# Patient Record
Sex: Female | Born: 1995 | Race: White | Hispanic: No | Marital: Single | Smoking: Never smoker
Health system: Southern US, Community
[De-identification: ages and names within clinical notes are randomized; demographics above are authoritative.]

## PROBLEM LIST (undated history)

## (undated) ENCOUNTER — Emergency Department (HOSPITAL_COMMUNITY): Admission: EM | Payer: Self-pay | Source: Home / Self Care

## (undated) DIAGNOSIS — J309 Allergic rhinitis, unspecified: Secondary | ICD-10-CM

## (undated) DIAGNOSIS — L709 Acne, unspecified: Secondary | ICD-10-CM

## (undated) HISTORY — DX: Acne, unspecified: L70.9

## (undated) HISTORY — DX: Allergic rhinitis, unspecified: J30.9

---
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
## 2017-02-12 IMAGING — DX DG FOOT COMPLETE 3+V*L*
3 series · 3 of 3 positions shown · non-contrast
Comparison: None.

CLINICAL DATA: Ballet dancer. Left foot pain and tenderness for 1
week. Initial encounter.

EXAM:
LEFT FOOT - COMPLETE 3+ VIEW

[foot ap]
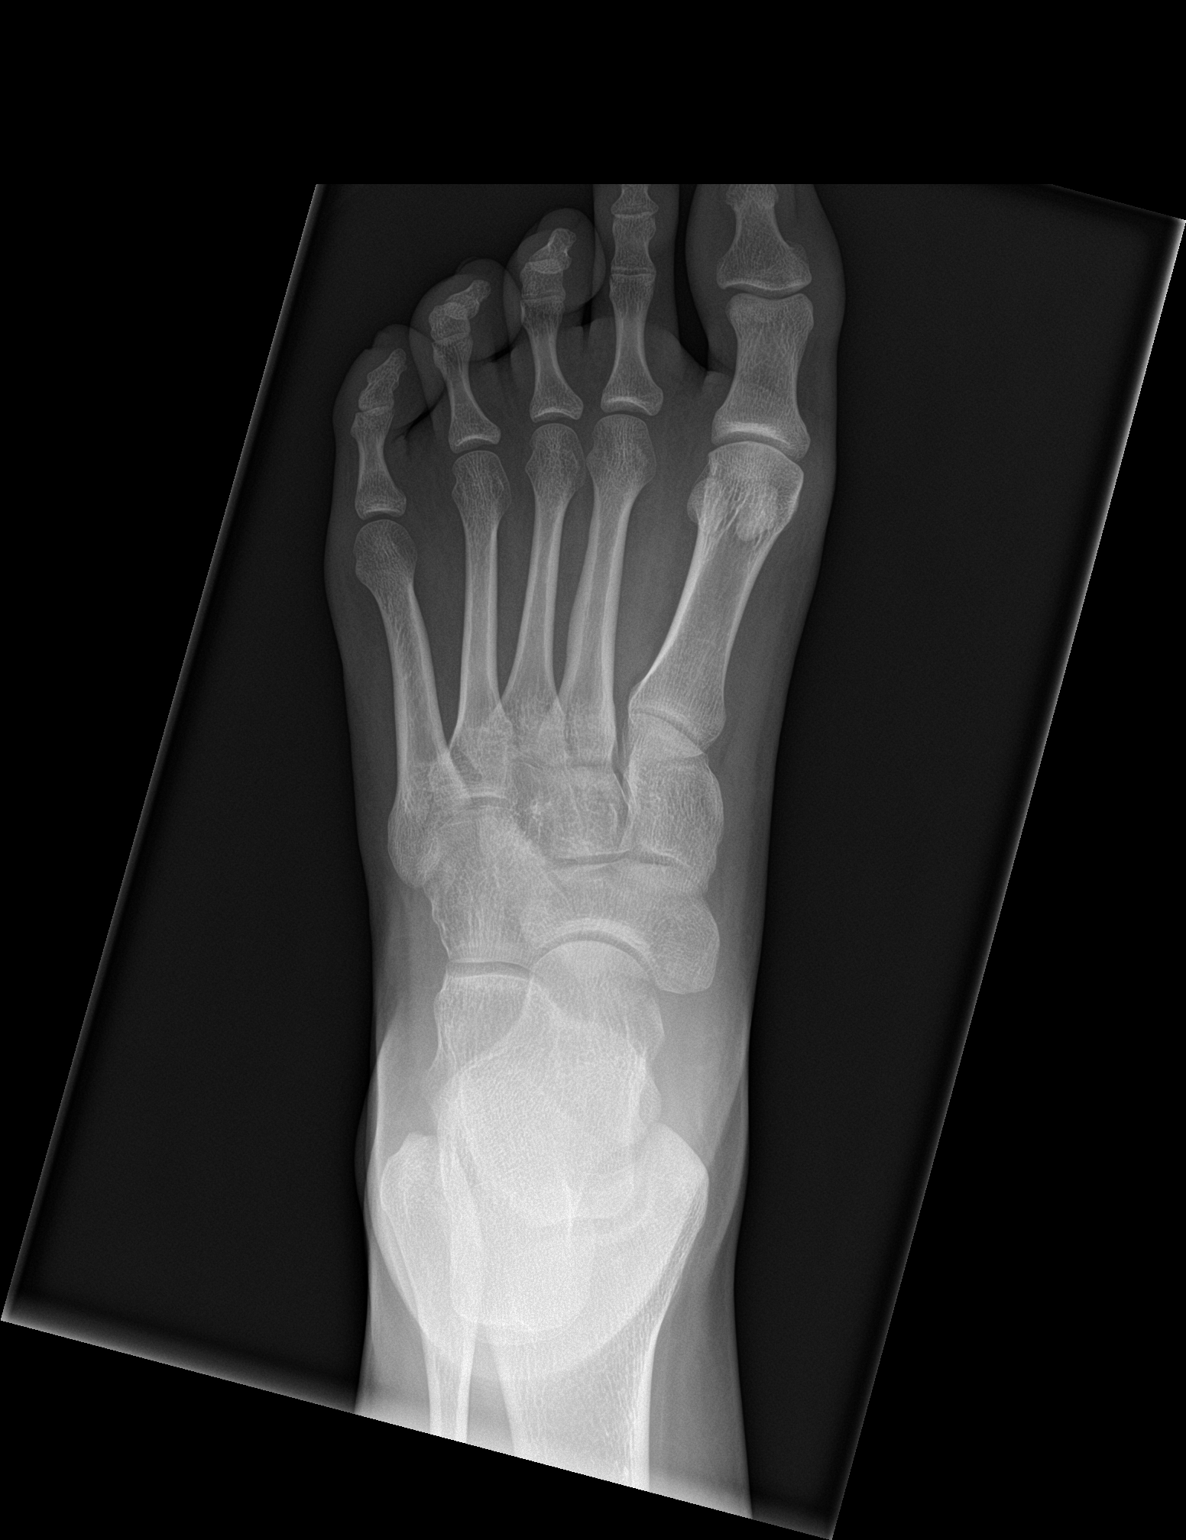

[foot obl]
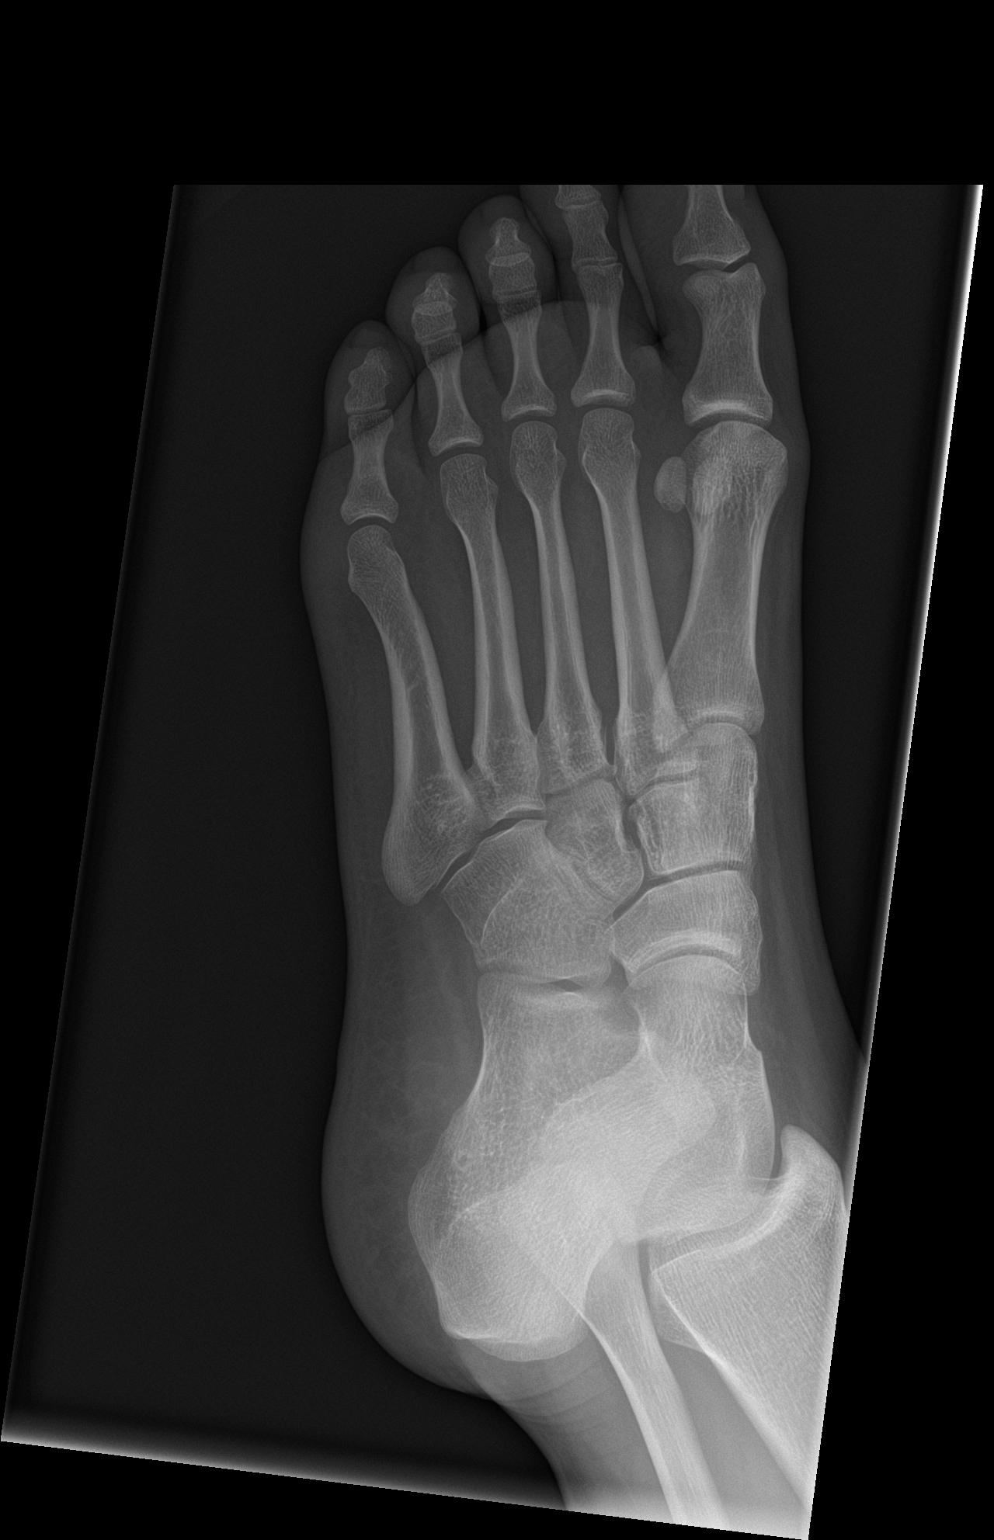

[foot lat]
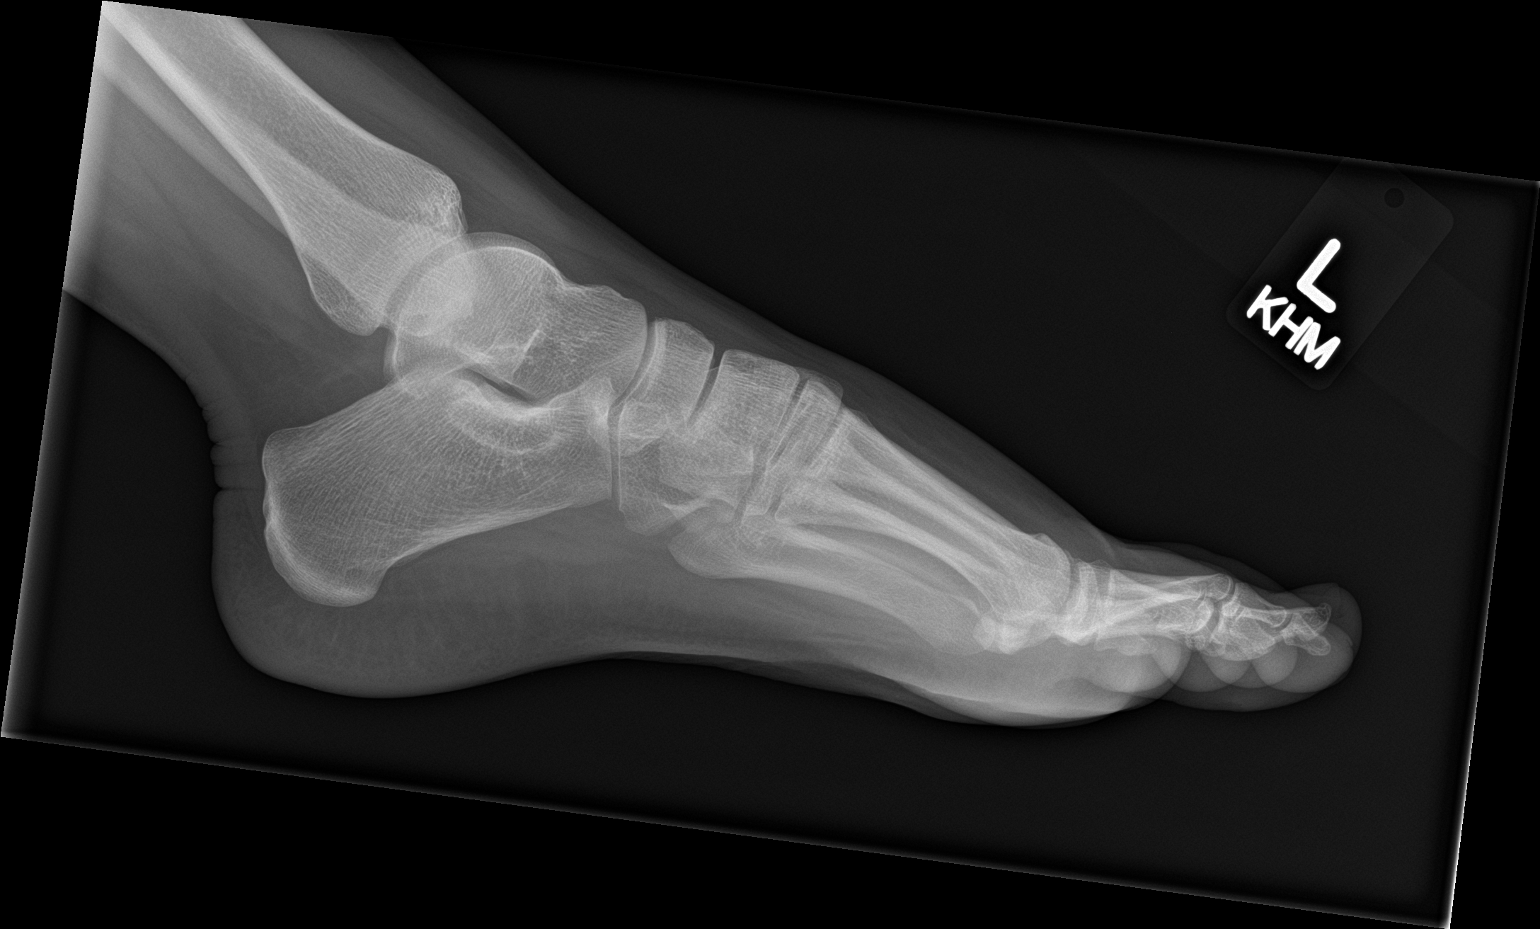

[3 of 3 positions shown; findings below may reference images not displayed]

FINDINGS: There is no evidence of fracture or dislocation. There is no
evidence of arthropathy or other focal bone abnormality. Soft
tissues are unremarkable.
IMPRESSION: Negative.

## 2017-05-16 DIAGNOSIS — J029 Acute pharyngitis, unspecified: Secondary | ICD-10-CM | POA: Diagnosis not present

## 2017-07-28 DIAGNOSIS — L700 Acne vulgaris: Secondary | ICD-10-CM | POA: Diagnosis not present

## 2017-10-13 DIAGNOSIS — J029 Acute pharyngitis, unspecified: Secondary | ICD-10-CM | POA: Diagnosis not present

## 2017-12-22 ENCOUNTER — Encounter: Payer: Self-pay | Admitting: Pediatrics

## 2018-02-24 ENCOUNTER — Encounter: Payer: Self-pay | Admitting: Pediatrics

## 2018-02-24 ENCOUNTER — Ambulatory Visit (INDEPENDENT_AMBULATORY_CARE_PROVIDER_SITE_OTHER): Payer: No Typology Code available for payment source | Admitting: Pediatrics

## 2018-02-24 VITALS — BP 104/72 | Ht 62.4 in | Wt 136.5 lb

## 2018-02-24 DIAGNOSIS — Z23 Encounter for immunization: Secondary | ICD-10-CM | POA: Diagnosis not present

## 2018-02-24 DIAGNOSIS — Z0000 Encounter for general adult medical examination without abnormal findings: Secondary | ICD-10-CM

## 2018-02-24 DIAGNOSIS — Z00129 Encounter for routine child health examination without abnormal findings: Secondary | ICD-10-CM

## 2018-02-24 LAB — POCT HEMOGLOBIN: HEMOGLOBIN: 16.2 g/dL — AB (ref 11–14.6)

## 2018-02-24 NOTE — Patient Instructions (Signed)
  Place appropriate patient instructions here. 

## 2018-02-24 NOTE — Progress Notes (Signed)
Adolescent Well Care Visit Emma Martinez is a 23 y.o. female who is here for well care.    PCP:  Shirlean Kelly, MD    History was provided by the patient and mother.  Confidentiality was discussed with the patient and, if applicable, with caregiver as well. Patient's personal or confidential phone number: [phone]   Current Issues: Current concerns include none    .   Nutrition: Nutrition/Eating Behaviors: balanced diet  Adequate calcium in diet?: yes  Supplements/ Vitamins: no   Exercise/ Media: Play any Sports?/ Exercise: daily  Screen Time:  > 2 hours-counseling provided Media Rules or Monitoring?: yes  Sleep:  Sleep: 9 hours   Social Screening: Lives with:  Parents  Parental relations:  good Activities, Work, and Regulatory affairs officer?: chores  Concerns regarding behavior with peers?  no Stressors of note: no  Education:  School Grade: college  School performance: doing well; no concerns School Behavior: doing well; no concerns  Menstruation:   A month ago  Menstrual History: normal monthly for 4-5 days    Confidential Social History: Tobacco?  no Secondhand smoke exposure?  no Drugs/ETOH?  no  Sexually Active?  no   Pregnancy Prevention: no sex and OCP   Safe at home, in school & in relationships?  Yes Safe to self?  Yes   Screenings: Patient has a dental home: yes  The patient completed the Rapid Assessment for Adolescent Preventive Services screening questionnaire and the following topics were identified as risk factors and discussed: healthy eating, exercise, seatbelt use, tobacco use, marijuana use, birth control, suicidality/self harm, mental health issues, social isolation and family problems  In addition, the following topics were discussed as part of anticipatory guidance healthy eating, exercise, drug use, birth control, suicidality/self harm, mental health issues, social isolation and family problems.  PHQ-9 completed and results indicated normal    Physical Exam:  Vitals:   02/24/18 1534  BP: 104/72  Weight: 136 lb 8 oz (61.9 kg)  Height: 5' 2.4" (1.585 m)   BP 104/72   Ht 5' 2.4" (1.585 m)   Wt 136 lb 8 oz (61.9 kg)   BMI 24.65 kg/m  Body mass index: body mass index is 24.65 kg/m. Growth percentile SmartLinks can only be used for patients less than 26 years old.   Hearing Screening   125Hz  250Hz  500Hz  1000Hz  2000Hz  3000Hz  4000Hz  6000Hz  8000Hz   Right ear:   20 20 20 20 20     Left ear:   20 20 20 20 20       Visual Acuity Screening   Right eye Left eye Both eyes  Without correction: 20/20 20/20   With correction:       General Appearance:   alert, oriented, no acute distress  HENT: Normocephalic, no obvious abnormality, conjunctiva clear  Mouth:   Normal appearing teeth, no obvious discoloration, dental caries, or dental caps  Neck:   Supple; thyroid: no enlargement, symmetric, no tenderness/mass/nodules  Chest No breast masses   Lungs:   Clear to auscultation bilaterally, normal work of breathing  Heart:   Regular rate and rhythm, S1 and S2 normal, no murmurs;   Abdomen:   Soft, non-tender, no mass, or organomegaly  GU genitalia not examined  Musculoskeletal:   Tone and strength strong and symmetrical, all extremities               Lymphatic:   No cervical adenopathy  Skin/Hair/Nails:   Skin warm, dry and intact, no rashes, no bruises or petechiae  Neurologic:   Strength, gait, and coordination normal and age-appropriate     Assessment and Plan:   23 yo healthy female   BMI is appropriate for age  Hearing screening result:normal Vision screening result: normal  Counseling provided for all of the vaccine components  Orders Placed This Encounter  Procedures  . GC/Chlamydia Probe Amp([facility])  . Meningococcal B, OMV (Bexsero)  . POCT hemoglobin     No follow-ups on file.. Discussed transition of care. Her mom has been looking for a new doctor for next year.   Richrd SoxQuan T Zoiee Wimmer, MD

## 2018-02-25 LAB — GC/CHLAMYDIA PROBE AMP
CHLAMYDIA, DNA PROBE: NEGATIVE
Neisseria gonorrhoeae by PCR: NEGATIVE

## 2018-08-12 ENCOUNTER — Other Ambulatory Visit: Payer: Self-pay

## 2018-08-12 ENCOUNTER — Ambulatory Visit (INDEPENDENT_AMBULATORY_CARE_PROVIDER_SITE_OTHER): Payer: No Typology Code available for payment source | Admitting: Family Medicine

## 2018-08-12 ENCOUNTER — Encounter: Payer: Self-pay | Admitting: Family Medicine

## 2018-08-12 ENCOUNTER — Encounter (INDEPENDENT_AMBULATORY_CARE_PROVIDER_SITE_OTHER): Payer: Self-pay

## 2018-08-12 VITALS — BP 119/75 | HR 73 | Temp 98.9°F | Resp 12 | Ht 62.0 in | Wt 131.0 lb

## 2018-08-12 DIAGNOSIS — J3081 Allergic rhinitis due to animal (cat) (dog) hair and dander: Secondary | ICD-10-CM | POA: Diagnosis not present

## 2018-08-12 DIAGNOSIS — L709 Acne, unspecified: Secondary | ICD-10-CM | POA: Diagnosis not present

## 2018-08-12 NOTE — Patient Instructions (Signed)
    Thank you for coming into the office today. I appreciate the opportunity to provide you with the care for your health and wellness. Today we discussed: overall health  Follow Up: 1 year (annual w/o pap) or as needed  No labs today. No referrals needed. Please get set up with [facility] for GYN needs, as soon you will need your first pap.  Continue to enjoy SUMMER, safely!   Work to get more WATER in your diet.   HEALTH MAINTENANCE RECOMMENDATIONS:  It is recommended that you get at least 30 minutes of aerobic exercise at least 5 days/week (for weight loss, you may need as much as 60-90 minutes). This can be any activity that gets your heart rate up. This can be divided in 10-15 minute intervals if needed, but try and build up your endurance at least once a week.  Weight bearing exercise is also recommended twice weekly.  Eat a healthy diet with lots of vegetables, fruits and fiber.  "Colorful" foods have a lot of vitamins (ie green vegetables, tomatoes, red peppers, etc).  Limit sweet tea, regular sodas and alcoholic beverages, all of which has a lot of calories and sugar.  Up to 1 alcoholic drink daily may be beneficial for women (unless trying to lose weight, watch sugars).  Drink a lot of water.  Calcium recommendations are 1200-1500 mg daily and vitamin D 1000 IU daily.  This should be obtained from diet and/or supplements (vitamins), and calcium should not be taken all at once, but in divided doses.  Monthly self breast exams. Usually after your cycle ends is best time to do these.  Sunscreen of at least SPF 30 should be used on all sun-exposed parts of the skin when outside between the hours of 10 am and 4 pm (not just when at beach or pool, but even with exercise, golf, tennis, and yard work!)  Use a sunscreen that says "broad spectrum" so it covers both UVA and UVB rays, and make sure to reapply every 1-2 hours.  Remember to change the batteries in your smoke detectors when  changing your clock times in the spring and fall.  Use your seat belt every time you are in a car, and please drive safely and not be distracted with cell phones and texting while driving.  Please continue to practice social distancing to keep you, your family, and our community safe. Wear a mask as you did to in the office if you must go out in public. And practice good hand washing.  [place] YOUR HANDS WELL AND FREQUENTLY. AVOID TOUCHING YOUR FACE, UNLESS YOUR HANDS ARE FRESHLY WASHED.  GET FRESH AIR DAILY. STAY HYDRATED WITH WATER.   It was a pleasure to see you and I look forward to continuing to work together on your health and well-being. Please do not hesitate to call the office if you need care or have questions about your care.  Have a wonderful day and weekend. With Gratitude,  Cherly Beach, DNP, AGNP-BC

## 2018-08-12 NOTE — Progress Notes (Signed)
Subjective:     Patient ID: Emma Martinez, female   DOB: 12/21/1995, 22 y.o.   MRN: [id number]  Emma Martinez presents for New Patient (Initial Visit) (establish care)  [name] is a 23 year old female patient who presents today to establish care with [facility] as she is turning [age] in November and will no longer be able to be seen at her pediatrician's office.  She is a Health and safety inspectorrising junior at [employer] majoring in stats unsure of what she would like to do with this but she does enjoy doing math.  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  Mother is a Runner, [employer] employee.  Has a history that includes allergic rhinitis is on Claritin reports she is allergic to pretty much anything that has animal hair.  History of acne is currently on a birth control pill to help with regulation of breakouts.  Pertinent family history would include diabetes, heart disease.  Both parents are still healthy at this time.  No history of surgeries.  Does not smoke, does not drink, does not use drugs.  Is sexually active uses birth control and condoms as preventative and safe measures.  Lives with both her parents right now she is home from school.  Her older sister is also living at home.  Just graduated from school for speech pathology.  They have a cat and a dog that lives in the home.  She is also allergic to these.  But she is close with the dog.  She enjoys to dance and go to [place] plays in her free time.  She is not currently in any committed relationship at this time.  Reports that she eats 2 meals regularly, from hello fresh she cooks at home.  Usually does not eat breakfast.  Reports that she gets enough fruits and veggies in her diet.  She reports that she does drink a pretty good amount of caffeine will drink 3 cans or so of the diet sun drop.  Occasionally will drink coffee and tea when she is at school.  Reports that she does not do  good and drinking water 1 to 2 cups if that daily.  Reports that she works out pretty regularly 5 days a week for about 90 minutes each time.  Is been doing hit YouTube videos secondary to COVID.  Reports that she wears her seatbelt and sunscreen.  Reports having smoke and carbon monoxide detectors.  Reports that she does not use her phone while she is driving so that it does not distract her.  Overall she has no complaints today in the office.  For health maintenance she is all up-to-date on her vaccines.  Did have her annual visit back in January at the pediatrician's office.  She will be turning 23 in November and will be needing a Pap, she would like to get set up with a GYN for this.  Recommendations for family tree.  Today in the office she has no complaints of fever, chills, cough, shortness of breath or any other signs or symptoms of COVID and/or infection.  Past Medical, Surgical, Social History, Allergies, and Medications have been Reviewed.   Past Medical History:  Diagnosis Date   Acne 10/06/2012   Allergic rhinitis 10/06/2012   No past surgical history on file. Social History   Socioeconomic History   Marital status: Single    Spouse name: Not on file   Number of  children: Not on file   Years of education: Not on file   Highest education level: Not on file  Occupational History   Not on file  Social Needs   Financial resource strain: Not on file   Food insecurity    Worry: Not on file    Inability: Not on file   Transportation needs    Medical: Not on file    Non-medical: Not on file  Tobacco Use   Smoking status: Never Smoker   Smokeless tobacco: Never Used  Substance and Sexual Activity   Alcohol use: Not on file   Drug use: Not on file   Sexual activity: Not on file  Lifestyle   Physical activity    Days per week: Not on file    Minutes per session: Not on file   Stress: Not on file  Relationships   Social connections    Talks on phone:  Not on file    Gets together: Not on file    Attends religious service: Not on file    Active member of club or organization: Not on file    Attends meetings of clubs or organizations: Not on file    Relationship status: Not on file   Intimate partner violence    Fear of current or ex partner: Not on file    Emotionally abused: Not on file    Physically abused: Not on file    Forced sexual activity: Not on file  Other Topics Concern   Not on file  Social History Narrative   Lives with both parents older sister in college    Outpatient Encounter Medications as of 08/12/2018  Medication Sig   ibuprofen (ADVIL,MOTRIN) 800 MG tablet    loratadine (CLARITIN) 10 MG tablet Take 1 tablet (10 mg total) by mouth daily.   TRI-PREVIFEM 0.18/0.215/0.25 MG-35 MCG tablet    No facility-administered encounter medications on file as of 08/12/2018.    Allergies  Allergen Reactions   Amoxicillin Hives    Patient states reaction was when she was in 3rd grade.    Review of Systems  Constitutional: Negative for activity change, appetite change, chills and fever.  HENT: Positive for congestion and rhinorrhea. Negative for ear pain, facial swelling, sneezing and sore throat.   Eyes: Negative for visual disturbance.  Respiratory: Negative for cough, shortness of breath and wheezing.   Cardiovascular: Negative for chest pain and palpitations.  Gastrointestinal: Negative.   Endocrine: Negative.   Genitourinary: Negative.   Musculoskeletal: Negative.   Skin: Negative.   Allergic/Immunologic: Positive for environmental allergies.  Neurological: Negative for dizziness and headaches.  Hematological: Negative.   Psychiatric/Behavioral: Negative.   All other systems reviewed and are negative.      Objective:     BP 119/75    Pulse 73    Temp 99 F (37.2 C) (Temporal)    Resp 12    Ht 5\' 2"  (1.575 m)    Wt 131 lb 0.6 oz (59.4 kg)    SpO2 100%    BMI 23.97 kg/m   Physical Exam Vitals signs  and nursing note reviewed.  Constitutional:      Appearance: Normal appearance. She is obese.  HENT:     Head: Normocephalic and atraumatic.     Right Ear: External ear normal.     Left Ear: External ear normal.     Nose: Nose normal.  Eyes:     General:        Right eye: No  discharge.        Left eye: No discharge.     Conjunctiva/sclera: Conjunctivae normal.  Neck:     Musculoskeletal: Normal range of motion and neck supple.  Cardiovascular:     Rate and Rhythm: Normal rate and regular rhythm.     Pulses: Normal pulses.     Heart sounds: Normal heart sounds.  Pulmonary:     Effort: Pulmonary effort is normal.     Breath sounds: Normal breath sounds.  Musculoskeletal: Normal range of motion.  Skin:    General: Skin is warm and dry.  Neurological:     Mental Status: She is alert and oriented to person, place, and time.  Psychiatric:        Mood and Affect: Mood normal.        Behavior: Behavior normal.        Thought Content: Thought content normal.        Judgment: Judgment normal.        Assessment and Plan       1. Acne, unspecified acne type Controlled on birth control to help with breakouts. Continue, does not need refills. Will look at getting in with [facility] for GYN health.  2. Allergic rhinitis due to animal hair and dander Controlled on Claritin.    Follow Up: 1 year or as needed      Perlie Mayo, DNP, AGNP-BC [place], [address] [place], [place] [address] Office Hours: Mon-Thurs 8 am-5 pm; Fri 8 am-12 pm Office Phone:  [phone]  Office Fax: [phone]

## 2018-10-18 ENCOUNTER — Other Ambulatory Visit: Payer: Self-pay

## 2018-10-18 DIAGNOSIS — Z20822 Contact with and (suspected) exposure to covid-19: Secondary | ICD-10-CM

## 2018-10-20 LAB — NOVEL CORONAVIRUS, NAA: SARS-CoV-2, NAA: DETECTED — AB

## 2018-10-20 NOTE — Progress Notes (Signed)
Please let her know that the COVID test is + and to follow the guidelines provided at time of testing. If she has questions just let me know.

## 2018-12-20 ENCOUNTER — Telehealth: Payer: Self-pay | Admitting: *Deleted

## 2018-12-20 ENCOUNTER — Encounter: Payer: Self-pay | Admitting: Family Medicine

## 2018-12-20 NOTE — Telephone Encounter (Signed)
Patient sent a mychart message and I responded to that message.

## 2018-12-20 NOTE — Telephone Encounter (Signed)
Tammy pts mom (NOT ON DPR OR EMERGENCY CONTACT) called saying pt is away at college and has injured her elbow. Thought maybe she needed a referral but wanted to see what needed to be done. Told her pt would have to call the office or send mychart message as she was not on DPR and we cannot discuss anything with her.

## 2018-12-23 ENCOUNTER — Encounter: Payer: Self-pay | Admitting: Family Medicine

## 2018-12-26 ENCOUNTER — Other Ambulatory Visit: Payer: Self-pay | Admitting: Family Medicine

## 2018-12-26 ENCOUNTER — Telehealth: Payer: Self-pay | Admitting: Orthopedic Surgery

## 2018-12-26 DIAGNOSIS — T148XXA Other injury of unspecified body region, initial encounter: Secondary | ICD-10-CM

## 2018-12-26 NOTE — Telephone Encounter (Signed)
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 Pending.

## 2018-12-27 NOTE — Telephone Encounter (Signed)
Any updated notes will be entered into referral workqueue, as referral was received by patient's primary care; refer to workqueue.

## 2018-12-28 ENCOUNTER — Telehealth: Payer: Self-pay | Admitting: Orthopedic Surgery

## 2018-12-28 NOTE — Telephone Encounter (Signed)
Pt's mom called back and left message.  I called her back and she said she forgot about having the [facility] referral done in Dr. Ruthe Mannan name and she would have to call and get that changed.  She asked if Tashawnda could get an appointment with Dr. Aline Brochure next week.  I spoke to [place] regarding this.  She said it was the patient's choice.  Appointment canceled with Dr. Marlou Sa for tomorrow in [place] and appointment was made here with Dr. Aline Brochure on Monday, 01/02/19 at 8:30

## 2018-12-28 NOTE — Telephone Encounter (Signed)
Patient's mother Lynelle Smoke called this morning about an appointment.  I told her that Dr Aline Brochure would only be here through this afternoon and then would be out the rest of the week.  Tammy said that Deeanna would not be home until late this afternoon and she felt that Lavana should really be seen before next week.  I spoke to our office supervisor, Brand Males.  She suggested that since [place] wouildnt be home until later today, maybe she could be seen at [facility].  Abigail Butts spoke with pt's mom Tammy and an appointment for Kathleen has been scheduledfor Thursday, 12/29/18 at 10:15 with Dr. Marlou Sa.

## 2018-12-29 ENCOUNTER — Ambulatory Visit: Payer: No Typology Code available for payment source | Admitting: Orthopedic Surgery

## 2019-01-02 ENCOUNTER — Ambulatory Visit: Payer: No Typology Code available for payment source | Admitting: Orthopedic Surgery

## 2019-01-02 ENCOUNTER — Ambulatory Visit: Payer: No Typology Code available for payment source

## 2019-01-02 ENCOUNTER — Other Ambulatory Visit: Payer: Self-pay

## 2019-01-02 VITALS — BP 99/68 | HR 80 | Temp 97.9°F | Ht 62.0 in | Wt 135.0 lb

## 2019-01-02 DIAGNOSIS — S42401A Unspecified fracture of lower end of right humerus, initial encounter for closed fracture: Secondary | ICD-10-CM

## 2019-01-02 DIAGNOSIS — S52124A Nondisplaced fracture of head of right radius, initial encounter for closed fracture: Secondary | ICD-10-CM

## 2019-01-02 NOTE — Patient Instructions (Signed)
Radial Head Fracture  A radial head fracture is a break in the smaller bone in your forearm (radius) near the end of the bone at the elbow joint. There are two bones in your forearm. The radius, or radial bone, is the bone on the side of your thumb. There are different types of radial head fractures. The type is determined by the amount of movement (displacement) of bones from their normal positions:  Type 1. This is a small fracture in which the bone pieces remain together (nondisplaced fracture).  Type 2. The fracture is moderate, and bone pieces are slightly displaced.  Type 3. There are multiple fractures and displaced bone pieces. What are the causes? This condition is usually caused by falling and landing on an outstretched arm. What increases the risk? You are more likely to develop this condition if you:  Are female.  Are 56-79 years old.  Have weak bones or osteoporosis. What are the signs or symptoms? Symptoms of this condition include:  Swelling of the elbow joint.  Pain and difficulty when moving the elbow joint. How is this diagnosed? This condition is diagnosed based on your medical history and a physical exam. You may have X-rays to confirm the type of fracture. How is this treated? Treatment for this condition includes resting, icing, and raising (elevating) the injured area above the level of your heart. You may be given medicines to help relieve pain. Treatment varies depending on the type of fracture you have.  For a type 1 fracture, you may be given a splint or sling to keep your arm and elbow from moving for several days.  For a type 2 fracture, you may be given a splint or sling to keep your arm and elbow from moving for several days. If the displacement is more severe, you may need surgery.  For a type 3 fracture, you will usually need surgery to have bone pieces removed. The entire radial head may need to be removed if the damage is severe. Follow these  instructions at home: Medicines  Take over-the-counter and prescription medicines only as told by your health care provider.  Ask your health care provider if the medicine prescribed to you: ? Requires you to avoid driving or using heavy machinery. ? Can cause constipation. You may need to take these actions to prevent or treat constipation:  Drink enough fluid to keep your urine pale yellow.  Take over-the-counter or prescription medicines.  Eat foods that are high in fiber, such as beans, whole grains, and fresh fruits and vegetables.  Limit foods that are high in fat and processed sugars, such as fried or sweet foods. If you have a splint or sling:  Wear the splint or sling as told by your health care provider. Remove it only as told by your health care provider.  Loosen it if your fingers tingle, become numb, or turn cold and blue.  Keep it clean and dry. If you have a splint:  Do not put pressure on any part of the splint until it is fully hardened. This may take several hours.  Check the skin around it every day. Tell your health care provider about any concerns. Bathing  Do not take baths, swim, or use a hot tub until your health care provider approves. Ask your health care provider if you may take showers. You may only be allowed to take sponge baths.  If the splint or sling is not waterproof: ? Do not let it get wet. ?  Cover it with a watertight covering when you take a bath or shower. Managing pain, stiffness, and swelling   If directed, put ice on the injured area. ? If you have a removable splint or sling, remove it as told by your health care provider. ? Put ice in a plastic bag. ? Place a towel between your skin and the bag. ? Leave the ice on for 20 minutes, 2-3 times a day.  Move your fingers often to reduce stiffness and swelling.  Raise (elevate) the injured area above the level of your heart while you are sitting or lying down. Activity  Ask your  health care provider when it is safe to drive if you have a splint or sling on your arm.  Do not lift anything that is heavier than 10 lb (4.5 kg), or the limit that you are told, until your health care provider says that it is safe.  Return to your normal activities as told by your health care provider. Ask your health care provider what activities are safe for you.  Do exercises as told by your health care provider or physical therapist. General instructions  Do not use any products that contain nicotine or tobacco, such as cigarettes, e-cigarettes, and chewing tobacco. These can delay bone healing. If you need help quitting, ask your health care provider.  Keep all follow-up visits as told by your health care provider. This is important. Contact a health care provider if:  You have problems with your splint.  You have pain or swelling that gets worse. Get help right away if:  You have severe pain.  You have fluid or a bad smell coming from your splint.  Your hand or fingers get cold or turn pale or blue.  You lose feeling in any part of your hand or arm. Summary  A radial head fracture is a break in the smaller bone in your forearm (radius) near the end of the bone at the elbow joint.  This condition usually happens because of an injury, such as falling on an outstretched arm.  This condition may be treated with rest, ice, elevation, pain medicines, a splint or sling, and surgery. Treatment will vary depending on the type of fracture you have. This information is not intended to replace advice given to you by your health care provider. Make sure you discuss any questions you have with your health care provider. Document Released: 12/01/2005 Document Revised: 02/17/2018 Document Reviewed: 02/17/2018 Elsevier Patient Education  2020 [employer].

## 2019-01-02 NOTE — Progress Notes (Signed)
EMERGENCY ROOM FOLLOW UP  NEW PROBLEM/PATIENT   Patient ID: Emma Martinez, female   DOB: [birth year], 23 y.o.   MRN: [id number]  Emergency room record from (date) 10/27 has been reviewed and this is included by reference and includes the review of systems with the following addition:   Chief Complaint  Patient presents with  . Elbow Injury    Right elbow injury, DOI 12-17-18.    HPI Emma Martinez is a 23 y.o. female.  23 year old female fell and injured her right elbow skating.  She was seen at [place] she comes in with their ER records and approximately 8 pages of notes with an x-ray report reading a nondisplaced radial head fracture but no images available.  Location right elbow Quality dull throbbing Severity 1-2/10 Duration October 24 date of injury Current medication ibuprofen   Review of Systems Review of Systems  Allergic/Immunologic: Positive for environmental allergies.     has a past medical history of Acne (10/06/2012) and Allergic rhinitis (10/06/2012).   No past surgical history on file.  Family History  Problem Relation Age of Onset  . Heart disease Other   . Cancer Other   . Healthy Mother   . Healthy Father   . Healthy Sister   . Diabetes Maternal Grandmother   . Cancer Paternal Grandmother   . Heart disease Paternal Grandfather     Social History Social History   Tobacco Use  . Smoking status: Never Smoker  . Smokeless tobacco: Never Used  Substance Use Topics  . Alcohol use: Never    Frequency: Never  . Drug use: Never    Allergies  Allergen Reactions  . Amoxicillin Hives    Patient states reaction was when she was in 3rd grade.    Current Outpatient Medications  Medication Sig Dispense Refill  . ibuprofen (ADVIL,MOTRIN) 800 MG tablet   0  . loratadine (CLARITIN) 10 MG tablet Take 1 tablet (10 mg total) by mouth daily. 30 tablet 4  . TRI-PREVIFEM 0.18/0.215/0.25 MG-35 MCG tablet   99   No current  facility-administered medications for this visit.     Physical Exam BP 99/68   Pulse 80   Temp 97.9 F (36.6 C)   Ht 5\' 2"  (1.575 m)   Wt 135 lb (61.2 kg)   BMI 24.69 kg/m  Body mass index is 24.69 kg/m.  Well developed and well nourished  Stands with normal weight bearing line  Alert and oriented x 3  Normal affect and mood  Ortho Exam  Left upper extremity inspection was normal, no range of motion deficit.  Elbow stable and reduced.  Strength normal.  Skin normal.  Right upper extremity  Physical Exam Musculoskeletal:     Comments: RUE: Inspection mild tenderness lateral elbow Range of motion full extension 125 degrees flexion Stability normal in abduction abduction/varus valgus Strength weak flexion extension Skin normal Pulse strong Sensation normal        Data Reviewed IMAGING From THE ER (report only) ARE REVIEWED, indicates probable nondisplaced radial head fracture  New images were obtained in the office on November 9  She was probably a radial neck fracture is nondisplaced has a little angulation  Assessment  Radial head fracture  Plan   Active range of motion no heavy lifting for 4 weeks follow-up as needed   Arther Abbott, MD 01/02/2019 8:45 AM

## 2019-03-06 ENCOUNTER — Telehealth: Payer: No Typology Code available for payment source | Admitting: Physician Assistant

## 2019-03-06 ENCOUNTER — Encounter: Payer: Self-pay | Admitting: Family Medicine

## 2019-03-06 ENCOUNTER — Telehealth: Payer: No Typology Code available for payment source

## 2019-03-06 DIAGNOSIS — L0291 Cutaneous abscess, unspecified: Secondary | ICD-10-CM | POA: Diagnosis not present

## 2019-03-06 MED ORDER — SULFAMETHOXAZOLE-TRIMETHOPRIM 800-160 MG PO TABS: 1.0000 | ORAL_TABLET | Freq: Two times a day (BID) | ORAL | 0 refills | Status: AC

## 2019-03-06 NOTE — Progress Notes (Signed)
E Visit for Cellulitis  Hi Nija,   Thank you for the details you included in the comment boxes. Those details are very helpful in determining the best course of treatment for you and help [place] to provide the best care.   I have prescribed:  Bactrim DS 1 tablet by mouth twice a day for 7 days  Also, apply a warm compress to the area 1-3 times daily for about 15 minutes.     If your symptoms are worsening (increased redness, pain or drainage from the area) then go ahead and be seen face to face at an urgent care clinic.  Sometimes these need to be drained despite being on antibiotics.   HOME CARE:  . Take your medications as ordered and take all of them, even if the skin irritation appears to be healing.   GET HELP RIGHT AWAY IF:  . Symptoms that don't begin to go away within 48 hours. . Severe redness persists or worsens . If the area turns color, spreads or swells. . If it blisters and opens, develops yellow-brown crust or bleeds. . You develop a fever or chills. . If the pain increases or becomes unbearable.  . Are unable to keep fluids and food down.  MAKE SURE YOU    Understand these instructions.  Will watch your condition.  Will get help right away if you are not doing well or get worse.  Thank you for choosing an e-visit. Your e-visit answers were reviewed by a board certified advanced clinical practitioner to complete your personal care plan. Depending upon the condition, your plan could have included both over the counter or prescription medications. Please review your pharmacy choice. Make sure the pharmacy is open so you can pick up prescription now. If there is a problem, you may contact your provider through [employer] and have the prescription routed to another pharmacy. Your safety is important to [place]. If you have drug allergies check your prescription carefully.  For the next 24 hours you can use MyChart to ask questions about today's visit, request a  non-urgent call back, or ask for a work or school excuse. You will get an email in the next two days asking about your experience. I hope that your e-visit has been valuable and will speed your recovery.  Greater than 5 minutes, yet less than 10 minutes of time have been spent researching, coordinating and implementing care for this patient today.

## 2019-06-03 ENCOUNTER — Ambulatory Visit: Payer: No Typology Code available for payment source | Attending: Internal Medicine

## 2019-06-03 DIAGNOSIS — Z23 Encounter for immunization: Secondary | ICD-10-CM

## 2019-06-03 NOTE — Progress Notes (Signed)
   Covid-19 Vaccination Clinic  Name:  LEDIA HANFORD    MRN: [id number] DOB: [birth year]  06/03/2019  Ms. Lascala was observed post Covid-19 immunization for 15 minutes without incident. She was provided with Vaccine Information Sheet and instruction to access the V-Safe system.   Ms. Kinnison was instructed to call 911 with any severe reactions post vaccine: Marland Kitchen Difficulty breathing  . Swelling of face and throat  . A fast heartbeat  . A bad rash all over body  . Dizziness and weakness   Immunizations Administered    Name Date Dose VIS Date Route   Pfizer COVID-19 Vaccine 06/03/2019  1:39 PM 0.3 mL 02/03/2019 Intramuscular   Manufacturer: [employer], [employer]   Lot: [id number]   NDC: [address]-2

## 2019-06-27 ENCOUNTER — Ambulatory Visit: Payer: No Typology Code available for payment source | Attending: Internal Medicine

## 2019-06-27 DIAGNOSIS — Z23 Encounter for immunization: Secondary | ICD-10-CM

## 2019-06-27 NOTE — Progress Notes (Signed)
   Covid-19 Vaccination Clinic  Name:  Emma Martinez    MRN: [id number] DOB: September 18, 1995  06/27/2019  Ms. Weed was observed post Covid-19 immunization for 15 minutes without incident. She was provided with Vaccine Information Sheet and instruction to access the V-Safe system.   Ms. Kagel was instructed to call 911 with any severe reactions post vaccine: Marland Kitchen Difficulty breathing  . Swelling of face and throat  . A fast heartbeat  . A bad rash all over body  . Dizziness and weakness   Immunizations Administered    Name Date Dose VIS Date Route   Pfizer COVID-19 Vaccine 06/27/2019  3:10 PM 0.3 mL 04/19/2018 Intramuscular   Manufacturer: [employer], [employer]   Lot: [id number]   NDC: [address]-3

## 2019-07-04 ENCOUNTER — Ambulatory Visit: Payer: No Typology Code available for payment source

## 2019-08-15 ENCOUNTER — Encounter: Payer: Self-pay | Admitting: Family Medicine

## 2019-08-15 ENCOUNTER — Other Ambulatory Visit: Payer: Self-pay

## 2019-08-15 ENCOUNTER — Ambulatory Visit (INDEPENDENT_AMBULATORY_CARE_PROVIDER_SITE_OTHER): Payer: No Typology Code available for payment source | Admitting: Family Medicine

## 2019-08-15 VITALS — BP 121/78 | HR 80 | Temp 97.2°F | Ht 62.0 in | Wt 140.8 lb

## 2019-08-15 DIAGNOSIS — Z124 Encounter for screening for malignant neoplasm of cervix: Secondary | ICD-10-CM | POA: Diagnosis not present

## 2019-08-15 DIAGNOSIS — Z0001 Encounter for general adult medical examination with abnormal findings: Secondary | ICD-10-CM | POA: Insufficient documentation

## 2019-08-15 DIAGNOSIS — E663 Overweight: Secondary | ICD-10-CM | POA: Diagnosis not present

## 2019-08-15 DIAGNOSIS — Z6825 Body mass index (BMI) 25.0-25.9, adult: Secondary | ICD-10-CM | POA: Diagnosis not present

## 2019-08-15 NOTE — Assessment & Plan Note (Signed)
Discussed monthly self breast exams and yearly mammograms; at least 30 minutes of aerobic activity at least 5 days/week and weight-bearing exercise 2x/week; proper sunscreen use reviewed; healthy diet, including goals of calcium and vitamin D intake and alcohol recommendations (less than or equal to 1 drink/day) reviewed; regular seatbelt use; changing batteries in smoke detectors.  Immunization recommendations discussed.    

## 2019-08-15 NOTE — Assessment & Plan Note (Signed)
Referral made for pap and family planning needs

## 2019-08-15 NOTE — Assessment & Plan Note (Signed)
  Emma Martinez is educated about the importance of exercise daily to help with weight management. A minumum of 30 minutes daily is recommended. Additionally, importance of healthy food choices with portion control discussed.   Wt Readings from Last 3 Encounters:  08/15/19 140 lb 12.8 oz (63.9 kg)  01/02/19 135 lb (61.2 kg)  08/12/18 131 lb 0.6 oz (59.4 kg)

## 2019-08-15 NOTE — Patient Instructions (Signed)
I appreciate the opportunity to provide you with care for your health and wellness. Today we discussed: overall health   Follow up: 1 year for annual no pap  No labs   Referrals today to [facility] for Pap  Please continue to practice social distancing to keep you, your family, and our community safe.  If you must go out, please wear a mask and practice good handwashing.  It was a pleasure to see you and I look forward to continuing to work together on your health and well-being. Please do not hesitate to call the office if you need care or have questions about your care.  Have a wonderful day and week. With Gratitude, Tereasa Coop, DNP, AGNP-BC  HEALTH MAINTENANCE RECOMMENDATIONS:  It is recommended that you get at least 30 minutes of aerobic exercise at least 5 days/week (for weight loss, you may need as much as 60-90 minutes). This can be any activity that gets your heart rate up. This can be divided in 10-15 minute intervals if needed, but try and build up your endurance at least once a week.  Weight bearing exercise is also recommended twice weekly.  Eat a healthy diet with lots of vegetables, fruits and fiber.  "Colorful" foods have a lot of vitamins (ie green vegetables, tomatoes, red peppers, etc).  Limit sweet tea, regular sodas and alcoholic beverages, all of which has a lot of calories and sugar.  Up to 1 alcoholic drink daily may be beneficial for women (unless trying to lose weight, watch sugars).  Drink a lot of water.  Monthly self breast exams and yearly mammograms for women over the age of 40 is recommended.  Sunscreen of at least SPF 30 should be used on all sun-exposed parts of the skin when outside between the hours of 10 am and 4 pm (not just when at beach or pool, but even with exercise, golf, tennis, and yard work!)  Use a sunscreen that says "broad spectrum" so it covers both UVA and UVB rays, and make sure to reapply every 1-2 hours.  Remember to change the  batteries in your smoke detectors when changing your clock times in the spring and fall.  Use your seat belt every time you are in a car, and please drive safely and not be distracted with cell phones and texting while driving.

## 2019-08-15 NOTE — Progress Notes (Signed)
Health Maintenance reviewed -   Immunization History  Administered Date(s) Administered  . DTaP 03/11/1998, 05/10/1998, 07/15/1998, 04/24/1999, 05/29/2003  . HPV Quadrivalent 08/30/2009, 08/25/2011, 01/11/2012  . Hepatitis A, Ped/Adol-2 Dose 09/02/2015, 03/06/2016  . Hepatitis B 04/24/1999, 09/23/1999, 03/24/2000  . HiB (PRP-OMP) 03/01/1998, 05/10/1998, 07/15/1998, 01/22/1999  . IPV 03/11/1998, 05/10/1998, 01/22/1999, 05/29/2003  . Influenza Nasal 12/12/2007, 12/19/2010, 01/11/2012, 11/28/2012  . Influenza Whole 03/02/2002, 12/11/2004, 12/10/2005  . Influenza,Quad,Nasal, Live 12/06/2013  . Influenza,inj,Quad PF,6+ Mos 03/06/2016  . Influenza-Unspecified 01/19/2007  . MMR 01/22/1999, 05/29/2003  . Meningococcal B, OMV 02/24/2018  . Meningococcal Conjugate 10/06/2012, 09/02/2015  . PFIZER SARS-COV-2 Vaccination 06/03/2019, 06/27/2019  . Pneumococcal Conjugate-13 07/15/1998, 10/16/1998, 01/22/1999  . Td 08/30/2009  . Tdap 08/30/2009  . Varicella 04/24/1999, 10/06/2012   Last Pap smear: referral to [facility] made  Last mammogram: n/a Last colonoscopy: n/a Last DEXA: n/a Dentist: Today is appt Ophtho: Not needed Exercise: running at times, had stopped but getting back into it. Smoker: no  Alcohol Use: no   Other doctors caring for patient include:  Patient Care Team: Perlie Mayo, NP as PCP - General (Family Medicine)  End of Life Discussion:  Patient does not have a living will and medical power of attorney  Subjective:   HPI  Emma Martinez is a 24 y.o. female who presents for annual wellness visit and follow-up on chronic medical conditions.  She has the following concerns: none  Review Of Systems  Review of Systems  Constitutional: Negative.   HENT: Negative.   Eyes: Negative.   Respiratory: Negative.   Cardiovascular: Negative.   Gastrointestinal: Negative.   Endocrine: Negative.   Genitourinary: Negative.   Musculoskeletal: Negative.   Skin:  Negative.   Allergic/Immunologic: Negative.   Neurological: Negative.   Hematological: Negative.   Psychiatric/Behavioral: Negative.   All other systems reviewed and are negative.   Objective:   PHYSICAL EXAM:  BP 121/78 (BP Location: Right Arm, Patient Position: Sitting, Cuff Size: Normal)   Pulse 80   Temp (!) 97.2 F (36.2 C) (Temporal)   Ht 5' 2"  (1.575 m)   Wt 140 lb 12.8 oz (63.9 kg)   SpO2 99%   BMI 25.75 kg/m   Physical Exam Vitals and nursing note reviewed.  Constitutional:      Appearance: Normal appearance. She is well-developed, well-groomed and overweight.  HENT:     Head: Normocephalic and atraumatic.     Right Ear: Hearing, tympanic membrane, ear canal and external ear normal.     Left Ear: Hearing, tympanic membrane, ear canal and external ear normal.     Nose: Nose normal.     Mouth/Throat:     Lips: Pink.     Mouth: Mucous membranes are moist.     Pharynx: Oropharynx is clear. Uvula midline.  Eyes:     General: Lids are normal.     Extraocular Movements: Extraocular movements intact.     Conjunctiva/sclera: Conjunctivae normal.     Pupils: Pupils are equal, round, and reactive to light.  Neck:     Thyroid: No thyroid mass, thyromegaly or thyroid tenderness.  Cardiovascular:     Rate and Rhythm: Normal rate and regular rhythm.     Pulses: Normal pulses.          Radial pulses are 2+ on the right side and 2+ on the left side.       Posterior tibial pulses are 2+ on the right side and 2+ on the left  side.     Heart sounds: Normal heart sounds.  Pulmonary:     Effort: Pulmonary effort is normal.     Breath sounds: Normal breath sounds and air entry.  Abdominal:     General: Abdomen is flat. Bowel sounds are normal.     Palpations: Abdomen is soft.     Tenderness: There is no abdominal tenderness. There is no right CVA tenderness or left CVA tenderness.  Musculoskeletal:        General: Normal range of motion.     Cervical back: Normal range of  motion and neck supple.     Right lower leg: No edema.     Left lower leg: No edema.     Comments: MAE, ROM intact  Feet:     Right foot:     Skin integrity: Skin integrity normal.     Left foot:     Skin integrity: Skin integrity normal.  Lymphadenopathy:     Cervical: No cervical adenopathy.  Skin:    General: Skin is warm and dry.     Capillary Refill: Capillary refill takes less than 2 seconds.  Neurological:     General: No focal deficit present.     Mental Status: She is alert and oriented to person, place, and time. Mental status is at baseline.     Cranial Nerves: Cranial nerves are intact.     Sensory: Sensation is intact.     Motor: Motor function is intact.     Coordination: Coordination is intact.     Gait: Gait is intact.     Deep Tendon Reflexes: Reflexes are normal and symmetric.  Psychiatric:        Attention and Perception: Attention and perception normal.        Mood and Affect: Mood and affect normal.        Speech: Speech normal.        Behavior: Behavior normal. Behavior is cooperative.        Thought Content: Thought content normal.        Cognition and Memory: Cognition and memory normal.        Judgment: Judgment normal.     Comments: Pleasant in conversation Good eye contact      Depression Screening  Depression screen [facility] 2/9 08/15/2019 08/12/2018 10/06/2012  Decreased Interest 0 0 0  Down, Depressed, Hopeless 0 0 0  PHQ - 2 Score 0 0 0  Altered sleeping 0 - 0  Tired, decreased energy 0 - 1  Change in appetite 0 - 0  Feeling bad or failure about yourself  0 - 0  Trouble concentrating 0 - 0  Moving slowly or fidgety/restless 0 - 0  Suicidal thoughts 0 - 0  PHQ-9 Score 0 - 1  Difficult doing work/chores Not difficult at all - -     Falls  Fall Risk  08/15/2019 08/12/2018  Falls in the past year? 0 0  Number falls in past yr: 0 -  Injury with Fall? 0 0  Risk for fall due to : No Fall Risks -  Follow up Falls evaluation completed -     Assessment & Plan:   1. Annual visit for general adult medical examination with abnormal findings   2. Encounter for screening for malignant neoplasm of cervix   3. Overweight with body mass index (BMI) of 25 to 25.9 in adult     Tests ordered Orders Placed This Encounter  Procedures  . Ambulatory referral to Obstetrics /  Gynecology     Plan: Please see assessment and plan per problem list above.   No orders of the defined types were placed in this encounter.     I have personally reviewed: The patient's medical and social history Their use of alcohol, tobacco or illicit drugs Their current medications and supplements The patient's functional ability including ADLs,fall risks, home safety risks, cognitive, and hearing and visual impairment Diet and physical activities Evidence for depression or mood disorders  The patient's weight, height, BMI, and visual acuity have been recorded in the chart.  I have made referrals, counseling, and provided education to the patient based on review of the above and I have provided the patient with a written personalized care plan for preventive services.    Note: This dictation was prepared with Dragon dictation along with smaller phrase technology. Similar sounding words can be transcribed inadequately or may not be corrected upon review. Any transcriptional errors that result from this process are unintentional.      Perlie Mayo, NP   08/15/2019

## 2019-10-25 ENCOUNTER — Telehealth: Payer: No Typology Code available for payment source | Admitting: Nurse Practitioner

## 2019-10-25 DIAGNOSIS — J0100 Acute maxillary sinusitis, unspecified: Secondary | ICD-10-CM

## 2019-10-25 MED ORDER — DOXYCYCLINE HYCLATE 100 MG PO TABS: 100.0000 mg | ORAL_TABLET | Freq: Two times a day (BID) | ORAL | 0 refills | Status: DC

## 2019-10-25 NOTE — Progress Notes (Signed)
Patient submitted twice

## 2019-10-25 NOTE — Progress Notes (Signed)

## 2020-05-31 ENCOUNTER — Telehealth: Payer: No Typology Code available for payment source | Admitting: Physician Assistant

## 2020-05-31 DIAGNOSIS — J029 Acute pharyngitis, unspecified: Secondary | ICD-10-CM

## 2020-05-31 NOTE — Progress Notes (Signed)
I have spent 5 minutes in review of e-visit questionnaire, review and updating patient chart, medical decision making and response to patient.   Ragena Fiola Cody Mikah Poss, PA-C    

## 2020-05-31 NOTE — Progress Notes (Signed)

## 2020-08-15 ENCOUNTER — Encounter: Payer: No Typology Code available for payment source | Admitting: Nurse Practitioner

## 2020-09-02 ENCOUNTER — Encounter: Payer: Self-pay | Admitting: Pediatrics

## 2020-10-21 ENCOUNTER — Telehealth: Payer: Self-pay | Admitting: Physician Assistant

## 2020-10-21 DIAGNOSIS — R300 Dysuria: Secondary | ICD-10-CM

## 2020-10-21 MED ORDER — NITROFURANTOIN MONOHYD MACRO 100 MG PO CAPS: 100.0000 mg | ORAL_CAPSULE | Freq: Two times a day (BID) | ORAL | 0 refills | Status: DC

## 2020-10-21 NOTE — Progress Notes (Signed)

## 2020-10-21 NOTE — Progress Notes (Signed)
I have spent 5 minutes in review of e-visit questionnaire, review and updating patient chart, medical decision making and response to patient.   Alphonza Tramell Cody Dilpreet Faires, PA-C    

## 2020-11-20 ENCOUNTER — Encounter: Payer: Self-pay | Admitting: Nurse Practitioner

## 2021-06-20 ENCOUNTER — Encounter: Payer: Self-pay | Admitting: Adult Health

## 2021-07-01 ENCOUNTER — Other Ambulatory Visit: Payer: No Typology Code available for payment source | Admitting: Adult Health

## 2021-08-22 ENCOUNTER — Encounter: Payer: No Typology Code available for payment source | Admitting: Family Medicine

## 2021-08-22 ENCOUNTER — Telehealth: Payer: No Typology Code available for payment source | Admitting: Physician Assistant

## 2021-08-22 DIAGNOSIS — R3989 Other symptoms and signs involving the genitourinary system: Secondary | ICD-10-CM

## 2021-08-22 MED ORDER — NITROFURANTOIN MONOHYD MACRO 100 MG PO CAPS: 100.0000 mg | ORAL_CAPSULE | Freq: Two times a day (BID) | ORAL | 0 refills | Status: DC

## 2021-08-22 NOTE — Progress Notes (Signed)

## 2021-08-25 ENCOUNTER — Encounter: Payer: Self-pay | Admitting: Family Medicine

## 2021-08-25 ENCOUNTER — Ambulatory Visit (INDEPENDENT_AMBULATORY_CARE_PROVIDER_SITE_OTHER): Payer: No Typology Code available for payment source | Admitting: Family Medicine

## 2021-08-25 VITALS — BP 124/79 | HR 100 | Ht 63.0 in | Wt 141.6 lb

## 2021-08-25 DIAGNOSIS — Z23 Encounter for immunization: Secondary | ICD-10-CM | POA: Diagnosis not present

## 2021-08-25 DIAGNOSIS — E559 Vitamin D deficiency, unspecified: Secondary | ICD-10-CM

## 2021-08-25 DIAGNOSIS — Z114 Encounter for screening for human immunodeficiency virus [HIV]: Secondary | ICD-10-CM

## 2021-08-25 DIAGNOSIS — Z0001 Encounter for general adult medical examination with abnormal findings: Secondary | ICD-10-CM | POA: Diagnosis not present

## 2021-08-25 DIAGNOSIS — R7301 Impaired fasting glucose: Secondary | ICD-10-CM | POA: Diagnosis not present

## 2021-08-25 NOTE — Assessment & Plan Note (Signed)
  Patient Counseling: --Nutrition: Stressed importance of moderation in sodium/caffeine intake, saturated fat and cholesterol, caloric balance, sufficient intake of fresh fruits, vegetables, fiber, calcium, iron, and 1 mg of folate supplement per day (for females capable of pregnancy). --Exercise: Stressed the importance of regular exercise. Pt reports exercising 4 days a week --Substance Abuse: Discussed cessation/primary prevention of tobacco, alcohol, or other drug use; driving or other dangerous activities under the influence; availability of treatment for abuse.  --Sexuality: Discussed sexually transmitted diseases, partner selection, use of condoms, avoidance of unintended pregnancy and contraceptive alternatives.  --Injury prevention: Discussed safety belts, safety helmets, smoke detector, smoking near bedding or upholstery.  --Dental health: Discussed importance of regular tooth brushing, flossing, and dental visits. --After hours service discussed with patient

## 2021-08-25 NOTE — Progress Notes (Signed)
Established Patient Office Visit  Subjective:  Patient ID: Emma Martinez, female    DOB: 1995-05-23  Age: 26 y.o. MRN: [id number]  CC:  Chief Complaint  Patient presents with   Annual Exam    Establishing care, here for CPE. No concerns.     HPI Emma Martinez is a 26 y.o. female seen today for her annual physical examination.    Past Medical History:  Diagnosis Date   Acne 10/06/2012   Allergic rhinitis 10/06/2012   Allergic rhinitis 10/06/2012    History reviewed. No pertinent surgical history.  Family History  Problem Relation Age of Onset   Heart disease Other    Cancer Other    Healthy Mother    Healthy Father    Healthy Sister    Diabetes Maternal Grandmother    Cancer Paternal Grandmother    Heart disease Paternal Grandfather     Social History   Socioeconomic History   Marital status: Single    Spouse name: Not on file   Number of children: Not on file   Years of education: Not on file   Highest education level: Some college, no degree  Occupational History   Not on file  Tobacco Use   Smoking status: Never   Smokeless tobacco: Never  Substance and Sexual Activity   Alcohol use: Never   Drug use: Never   Sexual activity: Yes    Birth control/protection: Pill, Condom  Other Topics Concern   Not on file  Social History Narrative   Lives with both parents when home from school (lives in [place] )    Older sister at home as well- school for speech pathology   Cat: Thea (allergy to cats)   Dog: Livie      Enjoy: dance, plays      Diet: Eats two meals daily: hello fresh; dont eat breakfast. Veggies and fruits    Caffeine: 3 cans of diet sundrop, some coffee and tea when in school   Water: 1-2 cups daily      Wear seat belt   Wears sunscreen (lifeguard over summer)   Smoke and carbon monoxides    Does not use phone while driving    Social Determinants of Health   Financial Resource Strain: Low Risk  (08/12/2018)   Overall  Financial Resource Strain (CARDIA)    Difficulty of Paying Living Expenses: Not hard at all  Food Insecurity: No Food Insecurity (08/12/2018)   Hunger Vital Sign    Worried About Running Out of Food in the Last Year: Never true    Ran Out of Food in the Last Year: Never true  Transportation Needs: No Transportation Needs (08/12/2018)   PRAPARE - Hydrologist (Medical): No    Lack of Transportation (Non-Medical): No  Physical Activity: Sufficiently Active (08/12/2018)   Exercise Vital Sign    Days of Exercise per Week: 5 days    Minutes of Exercise per Session: 90 min  Stress: No Stress Concern Present (08/12/2018)   [place]    Feeling of Stress : Not at all  Social Connections: Moderately Integrated (08/12/2018)   Social Connection and Isolation Panel [NHANES]    Frequency of Communication with Friends and Family: More than three times a week    Frequency of Social Gatherings with Friends and Family: More than three times a week    Attends Religious Services: More than 4 times per  year    Active Member of Clubs or Organizations: Yes    Attends Archivist Meetings: Never    Marital Status: Never married  Intimate Partner Violence: Not At Risk (08/12/2018)   Humiliation, Afraid, Rape, and Kick questionnaire    Fear of Current or Ex-Partner: No    Emotionally Abused: No    Physically Abused: No    Sexually Abused: No    Outpatient Medications Prior to Visit  Medication Sig Dispense Refill   ibuprofen (ADVIL,MOTRIN) 800 MG tablet   0   loratadine (CLARITIN) 10 MG tablet Take 1 tablet (10 mg total) by mouth daily. 30 tablet 4   nitrofurantoin, macrocrystal-monohydrate, (MACROBID) 100 MG capsule Take 1 capsule (100 mg total) by mouth 2 (two) times daily. 10 capsule 0   No facility-administered medications prior to visit.    Allergies  Allergen Reactions   Amoxicillin Hives     Patient states reaction was when she was in 3rd grade.    ROS Review of Systems  Constitutional:  Negative for chills, fatigue and fever.  HENT:  Negative for sinus pressure and sore throat.   Eyes:  Negative for photophobia, pain and redness.  Respiratory:  Negative for chest tightness, shortness of breath and wheezing.   Cardiovascular:  Negative for chest pain and palpitations.  Gastrointestinal:  Negative for constipation, diarrhea and rectal pain.  Endocrine: Negative for polydipsia, polyphagia and polyuria.  Genitourinary:  Negative for frequency and urgency.  Musculoskeletal:  Negative for joint swelling, myalgias and neck pain.  Skin:  Negative for rash and wound.  Neurological:  Negative for dizziness and light-headedness.  Hematological:  Does not bruise/bleed easily.  Psychiatric/Behavioral:  Negative for self-injury and suicidal ideas.       Objective:    Physical Exam HENT:     Head: Normocephalic.     Right Ear: External ear normal.     Left Ear: External ear normal.     Nose: No congestion or rhinorrhea.     Mouth/Throat:     Mouth: Mucous membranes are moist.  Eyes:     Extraocular Movements: Extraocular movements intact.     Pupils: Pupils are equal, round, and reactive to light.  Cardiovascular:     Rate and Rhythm: Normal rate and regular rhythm.     Pulses: Normal pulses.     Heart sounds: Normal heart sounds.  Pulmonary:     Effort: Pulmonary effort is normal.     Breath sounds: Normal breath sounds.  Abdominal:     Palpations: Abdomen is soft.  Musculoskeletal:     Cervical back: No rigidity.     Right lower leg: No edema.     Left lower leg: No edema.  Skin:    General: Skin is warm.     Capillary Refill: Capillary refill takes less than 2 seconds.  Neurological:     Mental Status: She is alert and oriented to person, place, and time.  Psychiatric:     Comments: Normal affect     BP 124/79   Pulse 100   Ht 5' 3"  (1.6 m)   Wt 141 lb  9.6 oz (64.2 kg)   SpO2 99%   BMI 25.08 kg/m  Wt Readings from Last 3 Encounters:  08/25/21 141 lb 9.6 oz (64.2 kg)  08/15/19 140 lb 12.8 oz (63.9 kg)  01/02/19 135 lb (61.2 kg)    No results found for: "TSH" Lab Results  Component Value Date   HGB 16.2 (A)  02/24/2018   No results found for: "NA", "K", "CHLORIDE", "CO2", "GLUCOSE", "BUN", "CREATININE", "BILITOT", "ALKPHOS", "AST", "ALT", "PROT", "ALBUMIN", "CALCIUM", "ANIONGAP", "EGFR", "GFR" No results found for: "CHOL" No results found for: "HDL" No results found for: "LDLCALC" No results found for: "TRIG" No results found for: "CHOLHDL" No results found for: "HGBA1C"    Assessment & Plan:   Problem List Items Addressed This Visit       Other   Encounter for general adult medical examination with abnormal findings - Primary     Patient Counseling: --Nutrition: Stressed importance of moderation in sodium/caffeine intake, saturated fat and cholesterol, caloric balance, sufficient intake of fresh fruits, vegetables, fiber, calcium, iron, and 1 mg of folate supplement per day (for females capable of pregnancy). --Exercise: Stressed the importance of regular exercise. Pt reports exercising 4 days a week --Substance Abuse: Discussed cessation/primary prevention of tobacco, alcohol, or other drug use; driving or other dangerous activities under the influence; availability of treatment for abuse.  --Sexuality: Discussed sexually transmitted diseases, partner selection, use of condoms, avoidance of unintended pregnancy and contraceptive alternatives.  --Injury prevention: Discussed safety belts, safety helmets, smoke detector, smoking near bedding or upholstery.  --Dental health: Discussed importance of regular tooth brushing, flossing, and dental visits. --After hours service discussed with patient      Relevant Orders   CBC with Differential/Platelet   CMP14+EGFR   TSH + free T4   Lipid panel   Chlamydia/GC NAA, Confirmation    Hepatitis C antibody   Other Visit Diagnoses     Immunization due       Relevant Orders   Tdap vaccine greater than or equal to 7yo IM   Encounter for screening for HIV       Relevant Orders   HIV antibody (with reflex)   Vitamin D deficiency       Relevant Orders   Vitamin D (25 hydroxy)   IFG (impaired fasting glucose)       Relevant Orders   Hemoglobin A1C       No orders of the defined types were placed in this encounter.   Follow-up: Return in about 3 weeks (around 09/15/2021) for pap smear.    Alvira Monday, FNP

## 2021-08-25 NOTE — Patient Instructions (Addendum)
I appreciate the opportunity to provide care to you today!    Follow up:  3 weeks pap smear  Labs: please stop by the lab during the week to get your blood drawn (CBC, CMP, TSH, Lipid profile, HgA1c, Vit D)  Screening: HIV   Thank you for getting your Tdap vaccine   Please continue to a heart-healthy diet and increase your physical activities. Try to exercise for at least three times a week.      It was a pleasure to see you and I look forward to continuing to work together on your health and well-being. Please do not hesitate to call the office if you need care or have questions about your care.   Have a wonderful day and week. With Gratitude, Gilmore Laroche MSN, FNP-BC

## 2021-08-28 LAB — CHLAMYDIA/GC NAA, CONFIRMATION
Chlamydia trachomatis, NAA: NEGATIVE
Neisseria gonorrhoeae, NAA: NEGATIVE

## 2021-08-28 NOTE — Progress Notes (Signed)
Please inform the patient that her chlamydia and gonorrhea results are negative

## 2021-09-15 ENCOUNTER — Other Ambulatory Visit (HOSPITAL_COMMUNITY)
Admission: RE | Admit: 2021-09-15 | Discharge: 2021-09-15 | Disposition: A | Discharge status: Home / Self Care | Payer: No Typology Code available for payment source | Source: Ambulatory Visit | Attending: Family Medicine | Admitting: Family Medicine

## 2021-09-15 ENCOUNTER — Encounter: Payer: Self-pay | Admitting: Family Medicine

## 2021-09-15 ENCOUNTER — Ambulatory Visit (INDEPENDENT_AMBULATORY_CARE_PROVIDER_SITE_OTHER): Payer: No Typology Code available for payment source | Admitting: Family Medicine

## 2021-09-15 VITALS — BP 116/68 | HR 119 | Ht 63.0 in | Wt 138.6 lb

## 2021-09-15 DIAGNOSIS — Z124 Encounter for screening for malignant neoplasm of cervix: Secondary | ICD-10-CM | POA: Insufficient documentation

## 2021-09-15 NOTE — Assessment & Plan Note (Signed)
-   According to the [employer], cytology and HPV co-testing (preferred) every 5 years or cytology alone (acceptable) every 3 years. - Pap due 2026  

## 2021-09-15 NOTE — Progress Notes (Signed)
Established Patient Office Visit  Subjective:  Patient ID: Emma Martinez, female    DOB: 1995/07/29  Age: 26 y.o. MRN: [id number]  CC:  Chief Complaint  Patient presents with   Follow-up    3 wk. PAP    HPI Emma Martinez is a 26 y.o. female seen today for a pap smear. She has regular menstrual cycles. She is currently in a monogamous relationship with her boyfriend of 6 years.   Past Medical History:  Diagnosis Date   Acne 10/06/2012   Allergic rhinitis 10/06/2012   Allergic rhinitis 10/06/2012    No past surgical history on file.  Family History  Problem Relation Age of Onset   Heart disease Other    Cancer Other    Healthy Mother    Healthy Father    Healthy Sister    Diabetes Maternal Grandmother    Cancer Paternal Grandmother    Heart disease Paternal Grandfather     Social History   Socioeconomic History   Marital status: Single    Spouse name: Not on file   Number of children: Not on file   Years of education: Not on file   Highest education level: Some college, no degree  Occupational History   Not on file  Tobacco Use   Smoking status: Never   Smokeless tobacco: Never  Substance and Sexual Activity   Alcohol use: Never   Drug use: Never   Sexual activity: Yes    Birth control/protection: Pill, Condom  Other Topics Concern   Not on file  Social History Narrative   Lives with both parents when home from school (lives in [place] )    Older sister at home as well- school for speech pathology   Cat: Thea (allergy to cats)   Dog: Livie      Enjoy: dance, plays      Diet: Eats two meals daily: hello fresh; dont eat breakfast. Veggies and fruits    Caffeine: 3 cans of diet sundrop, some coffee and tea when in school   Water: 1-2 cups daily      Wear seat belt   Wears sunscreen (lifeguard over summer)   Smoke and carbon monoxides    Does not use phone while driving    Social Determinants of Health   Financial Resource Strain: Low  Risk  (08/12/2018)   Overall Financial Resource Strain (CARDIA)    Difficulty of Paying Living Expenses: Not hard at all  Food Insecurity: No Food Insecurity (08/12/2018)   Hunger Vital Sign    Worried About Running Out of Food in the Last Year: Never true    Ran Out of Food in the Last Year: Never true  Transportation Needs: No Transportation Needs (08/12/2018)   PRAPARE - Hydrologist (Medical): No    Lack of Transportation (Non-Medical): No  Physical Activity: Sufficiently Active (08/12/2018)   Exercise Vital Sign    Days of Exercise per Week: 5 days    Minutes of Exercise per Session: 90 min  Stress: No Stress Concern Present (08/12/2018)   [place]    Feeling of Stress : Not at all  Social Connections: Moderately Integrated (08/12/2018)   Social Connection and Isolation Panel [NHANES]    Frequency of Communication with Friends and Family: More than three times a week    Frequency of Social Gatherings with Friends and Family: More than three times a week  Attends Religious Services: More than 4 times per year    Active Member of Clubs or Organizations: Yes    Attends Archivist Meetings: Never    Marital Status: Never married  Intimate Partner Violence: Not At Risk (08/12/2018)   Humiliation, Afraid, Rape, and Kick questionnaire    Fear of Current or Ex-Partner: No    Emotionally Abused: No    Physically Abused: No    Sexually Abused: No    Outpatient Medications Prior to Visit  Medication Sig Dispense Refill   ibuprofen (ADVIL,MOTRIN) 800 MG tablet   0   loratadine (CLARITIN) 10 MG tablet Take 1 tablet (10 mg total) by mouth daily. 30 tablet 4   nitrofurantoin, macrocrystal-monohydrate, (MACROBID) 100 MG capsule Take 1 capsule (100 mg total) by mouth 2 (two) times daily. 10 capsule 0   No facility-administered medications prior to visit.    Allergies  Allergen  Reactions   Amoxicillin Hives    Patient states reaction was when she was in 3rd grade.    ROS Review of Systems  Constitutional:  Negative for fever.  Respiratory:  Negative for shortness of breath.   Endocrine: Negative for polyphagia and polyuria.  Genitourinary:  Negative for decreased urine volume, vaginal bleeding, vaginal discharge and vaginal pain.  Psychiatric/Behavioral:  Negative for self-injury and suicidal ideas.       Objective:    Physical Exam HENT:     Head: Normocephalic.     Nose: No congestion.  Cardiovascular:     Rate and Rhythm: Normal rate and regular rhythm.     Pulses: Normal pulses.     Heart sounds: Normal heart sounds.  Abdominal:     Palpations: Abdomen is soft.  Genitourinary:    Exam position: Lithotomy position.     Pubic Area: No rash or pubic lice.      Tanner stage (genital): 5.     Labia:        Right: No rash, tenderness, lesion or injury.        Left: No rash, tenderness, lesion or injury.      Comments: Vaginal wall: pink and rugated, smooth and non-tender; absence of lesions, edema, and erythema. Labia Majora and Minora: present bilaterally, moist, soft tissue, and homogeneous; free of edema and ulcerations. Clitoris is anatomically present, above the urethral, and free of lesions, masses, and ulceration.  Neurological:     Mental Status: She is alert.     BP 116/68   Pulse (!) 119   Ht 5' 3"  (1.6 m)   Wt 138 lb 9.6 oz (62.9 kg)   SpO2 99%   BMI 24.55 kg/m  Wt Readings from Last 3 Encounters:  09/15/21 138 lb 9.6 oz (62.9 kg)  08/25/21 141 lb 9.6 oz (64.2 kg)  08/15/19 140 lb 12.8 oz (63.9 kg)    No results found for: "TSH" Lab Results  Component Value Date   HGB 16.2 (A) 02/24/2018   No results found for: "NA", "K", "CHLORIDE", "CO2", "GLUCOSE", "BUN", "CREATININE", "BILITOT", "ALKPHOS", "AST", "ALT", "PROT", "ALBUMIN", "CALCIUM", "ANIONGAP", "EGFR", "GFR" No results found for: "CHOL" No results found for:  "HDL" No results found for: "LDLCALC" No results found for: "TRIG" No results found for: "CHOLHDL" No results found for: "HGBA1C"    Assessment & Plan:   Problem List Items Addressed This Visit       Other   Cervical cancer screening - Primary    - According to the [employer], cytology and HPV  co-testing (preferred) every 5 years or cytology alone (acceptable) every 3 years. - Pap due 2026       Relevant Orders   Cytology - PAP    No orders of the defined types were placed in this encounter.   Follow-up: Return in about 5 months (around 02/15/2022).    Alvira Monday, FNP

## 2021-09-15 NOTE — Patient Instructions (Signed)
I appreciate the opportunity to provide care to you today!    Follow up:  5 months     Please continue to a heart-healthy diet and increase your physical activities. Try to exercise for at least three times a week.      It was a pleasure to see you and I look forward to continuing to work together on your health and well-being. Please do not hesitate to call the office if you need care or have questions about your care.   Have a wonderful day and week. With Gratitude, Gilmore Laroche MSN, FNP-BC

## 2021-09-18 LAB — CYTOLOGY - PAP: Diagnosis: NEGATIVE

## 2021-09-18 NOTE — Progress Notes (Signed)
Please inform the patient that her Pap smear result was negative

## 2021-10-04 ENCOUNTER — Telehealth: Payer: No Typology Code available for payment source | Admitting: Nurse Practitioner

## 2021-10-04 DIAGNOSIS — N390 Urinary tract infection, site not specified: Secondary | ICD-10-CM | POA: Diagnosis not present

## 2021-10-04 MED ORDER — NITROFURANTOIN MONOHYD MACRO 100 MG PO CAPS: 100.0000 mg | ORAL_CAPSULE | Freq: Two times a day (BID) | ORAL | 0 refills | Status: AC

## 2021-10-04 NOTE — Progress Notes (Signed)
I have spent 5 minutes in review of e-visit questionnaire, review and updating patient chart, medical decision making and response to patient.  ° °Devita Nies W Azana Kiesler, NP ° °  °

## 2021-10-04 NOTE — Progress Notes (Signed)

## 2021-12-19 NOTE — Addendum Note (Signed)
Addended byAlvira Monday on: 12/19/2021 06:00 PM   Modules accepted: Level of Service

## 2022-02-07 ENCOUNTER — Telehealth: Payer: No Typology Code available for payment source | Admitting: Family

## 2022-02-07 DIAGNOSIS — J069 Acute upper respiratory infection, unspecified: Secondary | ICD-10-CM | POA: Diagnosis not present

## 2022-02-08 MED ORDER — BENZONATATE 100 MG PO CAPS: 100.0000 mg | ORAL_CAPSULE | Freq: Three times a day (TID) | ORAL | 0 refills | Status: DC | PRN

## 2022-02-08 MED ORDER — FLUTICASONE PROPIONATE 50 MCG/ACT NA SUSP: 2.0000 | Freq: Every day | NASAL | 6 refills | Status: DC

## 2022-02-08 NOTE — Progress Notes (Signed)

## 2022-02-18 ENCOUNTER — Ambulatory Visit (INDEPENDENT_AMBULATORY_CARE_PROVIDER_SITE_OTHER): Payer: No Typology Code available for payment source | Admitting: Family Medicine

## 2022-02-18 ENCOUNTER — Encounter: Payer: Self-pay | Admitting: Family Medicine

## 2022-02-18 VITALS — BP 136/83 | HR 90 | Ht 63.0 in | Wt 131.0 lb

## 2022-02-18 DIAGNOSIS — E7849 Other hyperlipidemia: Secondary | ICD-10-CM

## 2022-02-18 DIAGNOSIS — Z1159 Encounter for screening for other viral diseases: Secondary | ICD-10-CM

## 2022-02-18 DIAGNOSIS — Z0001 Encounter for general adult medical examination with abnormal findings: Secondary | ICD-10-CM

## 2022-02-18 DIAGNOSIS — Z23 Encounter for immunization: Secondary | ICD-10-CM | POA: Diagnosis not present

## 2022-02-18 DIAGNOSIS — R7301 Impaired fasting glucose: Secondary | ICD-10-CM

## 2022-02-18 DIAGNOSIS — E559 Vitamin D deficiency, unspecified: Secondary | ICD-10-CM

## 2022-02-18 DIAGNOSIS — E038 Other specified hypothyroidism: Secondary | ICD-10-CM

## 2022-02-18 NOTE — Assessment & Plan Note (Signed)
Patient voices no complaints or concerns today We will follow-up in 6 months Pending labs

## 2022-02-18 NOTE — Assessment & Plan Note (Signed)
Patient educated on CDC recommendation for the vaccine. Verbal consent was obtained from the patient, vaccine administered by nurse, no sign of adverse reactions noted at this time. Patient education on arm soreness and use of tylenol or ibuprofen for this patient  was discussed. Patient educated on the signs and symptoms of adverse effect and advise to contact the office if they occur.  

## 2022-02-18 NOTE — Progress Notes (Signed)
Established Patient Office Visit  Subjective:  Patient ID: Emma Martinez, female    DOB: 1995-03-21  Age: 26 y.o. MRN: [id number]  CC:  Chief Complaint  Patient presents with   Follow-up    5 month f/u no concerns.    HPI Emma Martinez is a 26 y.o. female with of  presents for f/u of  chronic medical conditions. For the details of today's visit, please refer to the assessment and plan.     Past Medical History:  Diagnosis Date   Acne 10/06/2012   Allergic rhinitis 10/06/2012   Allergic rhinitis 10/06/2012    History reviewed. No pertinent surgical history.  Family History  Problem Relation Age of Onset   Heart disease Other    Cancer Other    Healthy Mother    Healthy Father    Healthy Sister    Diabetes Maternal Grandmother    Cancer Paternal Grandmother    Heart disease Paternal Grandfather     Social History   Socioeconomic History   Marital status: Single    Spouse name: Not on file   Number of children: Not on file   Years of education: Not on file   Highest education level: Some college, no degree  Occupational History   Not on file  Tobacco Use   Smoking status: Never   Smokeless tobacco: Never  Substance and Sexual Activity   Alcohol use: Never   Drug use: Never   Sexual activity: Yes    Birth control/protection: Pill, Condom  Other Topics Concern   Not on file  Social History Narrative   Lives with both parents when home from school (lives in [place] )    Older sister at home as well- school for speech pathology   Cat: Thea (allergy to cats)   Dog: Livie      Enjoy: dance, plays      Diet: Eats two meals daily: hello fresh; dont eat breakfast. Veggies and fruits    Caffeine: 3 cans of diet sundrop, some coffee and tea when in school   Water: 1-2 cups daily      Wear seat belt   Wears sunscreen (lifeguard over summer)   Smoke and carbon monoxides    Does not use phone while driving    Social Determinants of Health    Financial Resource Strain: Low Risk  (08/12/2018)   Overall Financial Resource Strain (CARDIA)    Difficulty of Paying Living Expenses: Not hard at all  Food Insecurity: No Food Insecurity (08/12/2018)   Hunger Vital Sign    Worried About Running Out of Food in the Last Year: Never true    Ran Out of Food in the Last Year: Never true  Transportation Needs: No Transportation Needs (08/12/2018)   PRAPARE - Hydrologist (Medical): No    Lack of Transportation (Non-Medical): No  Physical Activity: Sufficiently Active (08/12/2018)   Exercise Vital Sign    Days of Exercise per Week: 5 days    Minutes of Exercise per Session: 90 min  Stress: No Stress Concern Present (08/12/2018)   [place]    Feeling of Stress : Not at all  Social Connections: Moderately Integrated (08/12/2018)   Social Connection and Isolation Panel [NHANES]    Frequency of Communication with Friends and Family: More than three times a week    Frequency of Social Gatherings with Friends and Family: More than three  times a week    Attends Religious Services: More than 4 times per year    Active Member of Clubs or Organizations: Yes    Attends Archivist Meetings: Never    Marital Status: Never married  Intimate Partner Violence: Not At Risk (08/12/2018)   Humiliation, Afraid, Rape, and Kick questionnaire    Fear of Current or Ex-Partner: No    Emotionally Abused: No    Physically Abused: No    Sexually Abused: No    Outpatient Medications Prior to Visit  Medication Sig Dispense Refill   benzonatate (TESSALON PERLES) 100 MG capsule Take 1 capsule (100 mg total) by mouth 3 (three) times daily as needed. (Patient not taking: Reported on 02/18/2022) 20 capsule 0   fluticasone (FLONASE) 50 MCG/ACT nasal spray Place 2 sprays into both nostrils daily. (Patient not taking: Reported on 02/18/2022) 16 g 6   ibuprofen  (ADVIL,MOTRIN) 800 MG tablet  (Patient not taking: Reported on 02/18/2022)  0   loratadine (CLARITIN) 10 MG tablet Take 1 tablet (10 mg total) by mouth daily. (Patient not taking: Reported on 02/18/2022) 30 tablet 4   No facility-administered medications prior to visit.    Allergies  Allergen Reactions   Amoxicillin Hives    Patient states reaction was when she was in 3rd grade.    ROS Review of Systems  Constitutional:  Negative for chills and fever.  Eyes:  Negative for visual disturbance.  Respiratory:  Negative for chest tightness and shortness of breath.   Neurological:  Negative for dizziness and headaches.      Objective:    Physical Exam HENT:     Head: Normocephalic.     Mouth/Throat:     Mouth: Mucous membranes are moist.  Cardiovascular:     Rate and Rhythm: Normal rate.     Heart sounds: Normal heart sounds.  Pulmonary:     Effort: Pulmonary effort is normal.     Breath sounds: Normal breath sounds.  Neurological:     Mental Status: She is alert.     BP 136/83   Pulse 90   Ht _0  (1.6 m)   Wt 131 lb 0.6 oz (59.4 kg)   SpO2 98%   BMI 23.21 kg/m  Wt Readings from Last 3 Encounters:  02/18/22 131 lb 0.6 oz (59.4 kg)  09/15/21 138 lb 9.6 oz (62.9 kg)  08/25/21 141 lb 9.6 oz (64.2 kg)    No results found for: "TSH" Lab Results  Component Value Date   HGB 16.2 (A) 02/24/2018   No results found for: "NA", "K", "CHLORIDE", "CO2", "GLUCOSE", "BUN", "CREATININE", "BILITOT", "ALKPHOS", "AST", "ALT", "PROT", "ALBUMIN", "CALCIUM", "ANIONGAP", "EGFR", "GFR" No results found for: "CHOL" No results found for: "HDL" No results found for: "LDLCALC" No results found for: "TRIG" No results found for: "CHOLHDL" No results found for: "HGBA1C"    Assessment & Plan:  Need for immunization against influenza Assessment & Plan: Patient educated on CDC recommendation for the vaccine. Verbal consent was obtained from the patient, vaccine administered by nurse, no  sign of adverse reactions noted at this time. Patient education on arm soreness and use of tylenol or ibuprofen for this patient  was discussed. Patient educated on the signs and symptoms of adverse effect and advise to contact the office if they occur.    Flu vaccine need -     Flu Vaccine QUAD 36moIM (Fluarix, Fluzone & Alfiuria Quad PF)  Encounter for general adult medical examination with abnormal  findings Assessment & Plan: Patient voices no complaints or concerns today We will follow-up in 6 months Pending labs    IFG (impaired fasting glucose) -     Hemoglobin A1c  Vitamin D deficiency -     VITAMIN D 25 Hydroxy (Vit-D Deficiency, Fractures)  Need for hepatitis C screening test -     Hepatitis C antibody  Other specified hypothyroidism -     TSH + free T4  Other hyperlipidemia -     Lipid panel -     CMP14+EGFR -     CBC with Differential/Platelet    Follow-up: Return in about 6 months (around 08/20/2022).   Alvira Monday, FNP

## 2022-02-18 NOTE — Patient Instructions (Addendum)
I appreciate the opportunity to provide care to you today!    Follow up:  6 months  Labs: please stop by the lab during the week to get your blood drawn (CBC, CMP, TSH, Lipid profile, HgA1c, Vit D)     Please continue to a heart-healthy diet and increase your physical activities. Try to exercise for at least three times a week.      It was a pleasure to see you and I look forward to continuing to work together on your health and well-being. Please do not hesitate to call the office if you need care or have questions about your care.   Have a wonderful day and week. With Gratitude, Gilmore Laroche MSN, FNP-BC

## 2022-03-01 ENCOUNTER — Telehealth: Payer: 59 | Admitting: Physician Assistant

## 2022-03-01 DIAGNOSIS — J4520 Mild intermittent asthma, uncomplicated: Secondary | ICD-10-CM

## 2022-03-02 NOTE — Progress Notes (Signed)
Because you have never been diagnosed with asthma, I feel your condition warrants further evaluation and I recommend that you be seen for a face to face visit.  You should be seen for a full evaluation and to possibly have lung function testing to confirm a diagnosis for you. Please contact your primary care physician practice to be seen. Many offices offer virtual options to be seen via video if you are not comfortable going in person to a medical facility at this time.  NOTE: You will NOT be charged for this eVisit.  If you do not have a PCP, [facility] offers a free physician referral service available at [phone]. Our trained staff has the experience, knowledge and resources to put you in touch with a physician who is right for you.    If you are having a true medical emergency please call 911.   Your e-visit answers were reviewed by a board certified advanced clinical practitioner to complete your personal care plan.  Thank you for using e-Visits.  I have spent 5 minutes in review of e-visit questionnaire, review and updating patient chart, medical decision making and response to patient.   Mar Daring, PA-C

## 2022-03-09 DIAGNOSIS — R55 Syncope and collapse: Secondary | ICD-10-CM | POA: Diagnosis not present

## 2022-03-09 DIAGNOSIS — S0003XA Contusion of scalp, initial encounter: Secondary | ICD-10-CM | POA: Diagnosis not present

## 2022-03-09 DIAGNOSIS — R609 Edema, unspecified: Secondary | ICD-10-CM | POA: Diagnosis not present

## 2022-03-09 DIAGNOSIS — I959 Hypotension, unspecified: Secondary | ICD-10-CM | POA: Diagnosis not present

## 2022-03-09 DIAGNOSIS — S0990XA Unspecified injury of head, initial encounter: Secondary | ICD-10-CM | POA: Diagnosis not present

## 2022-03-09 DIAGNOSIS — R001 Bradycardia, unspecified: Secondary | ICD-10-CM | POA: Diagnosis not present

## 2022-03-09 DIAGNOSIS — W228XXA Striking against or struck by other objects, initial encounter: Secondary | ICD-10-CM | POA: Diagnosis not present

## 2022-03-17 ENCOUNTER — Encounter: Payer: Self-pay | Admitting: Family Medicine

## 2022-04-07 ENCOUNTER — Encounter: Payer: Self-pay | Admitting: Internal Medicine

## 2022-04-07 ENCOUNTER — Ambulatory Visit (INDEPENDENT_AMBULATORY_CARE_PROVIDER_SITE_OTHER): Payer: 59 | Admitting: Internal Medicine

## 2022-04-07 VITALS — BP 127/87 | HR 98 | Resp 16 | Ht 63.0 in | Wt 133.1 lb

## 2022-04-07 DIAGNOSIS — J309 Allergic rhinitis, unspecified: Secondary | ICD-10-CM | POA: Diagnosis not present

## 2022-04-07 DIAGNOSIS — J320 Chronic maxillary sinusitis: Secondary | ICD-10-CM | POA: Diagnosis not present

## 2022-04-07 MED ORDER — AZELASTINE-FLUTICASONE 137-50 MCG/ACT NA SUSP: 2.0000 | Freq: Two times a day (BID) | NASAL | 0 refills | Status: DC

## 2022-04-07 NOTE — Assessment & Plan Note (Addendum)
Plan: Chronic illness with exacerbation -Continue Zyrtec daily -Start Dymista nasal spray -Referral to allergy and immunology for allergy testing

## 2022-04-07 NOTE — Progress Notes (Signed)
   HPI:Emma Martinez is a 27 y.o. female who presents for evaluation of chronic stuffy nose.  Patient reports history of chronic allergies.  She currently takes Zyrtec but allergies are uncontrolled.  She passed out in January at work and had a CT head which mention hyperdensity within right maxillary sinus which could reflect inspissated dried secretions or chronic fungal sinusitis.  She does notice cats make her allergies worse and her boyfriend has a cat.  She has never had allergy testing.  She occasionally has some wheezing. No fever or chills.   Physical Exam: Vitals:   04/07/22 0807  BP: 127/87  Pulse: 98  Resp: 16  SpO2: 98%  Weight: 133 lb 1.9 oz (60.4 kg)  Height: 5\' 3"  (1.6 m)     Physical Exam Constitutional:      Appearance: She is well-developed and well-groomed.  HENT:     Head:     Comments: No tenderness with palpation over maxillary sinus    Right Ear: Tympanic membrane and ear canal normal. No tenderness. Tympanic membrane is not bulging.     Left Ear: Tympanic membrane and ear canal normal. No tenderness. Tympanic membrane is not bulging.     Nose: Congestion present.  Cardiovascular:     Rate and Rhythm: Normal rate and regular rhythm.  Pulmonary:     Effort: Pulmonary effort is normal.     Breath sounds: Normal breath sounds. No wheezing or rhonchi.      Assessment & Plan:   Allergic rhinitis Plan: Chronic illness with exacerbation -Continue Zyrtec daily -Start Dymista nasal spray -Referral to allergy and immunology for allergy testing    Chronic maxillary sinusitis - Patient has chronic allergies which are uncontrolled.  Sent for allergy testing.  CT head has incidental finding of hyperdensity in right maxillary sinus.  Radiologist mentions this could be chronic fungal sinusitis. Will refer to ENT for further evaluation     Lorene Dy, MD

## 2022-04-07 NOTE — Patient Instructions (Addendum)
Thank you, Ms.Iyana A Riddell for allowing [place] to provide your care today.     Referrals ordered today:    Referral Orders         Ambulatory referral to Allergy         Ambulatory referral to ENT       Dymista nasal spray sent to your pharmacy    Tamsen Snider, M.D.

## 2022-04-07 NOTE — Assessment & Plan Note (Addendum)
-   Patient has chronic allergies which are uncontrolled.  Sent for allergy testing.  CT head has incidental finding of hyperdensity in right maxillary sinus.  Radiologist mentions this could be chronic fungal sinusitis. Will refer to ENT for further evaluation

## 2022-04-27 ENCOUNTER — Ambulatory Visit: Payer: 59 | Admitting: Family Medicine

## 2022-04-28 ENCOUNTER — Ambulatory Visit: Payer: 59 | Admitting: Family Medicine

## 2022-05-16 ENCOUNTER — Telehealth: Payer: 59 | Admitting: Physician Assistant

## 2022-05-16 DIAGNOSIS — J4520 Mild intermittent asthma, uncomplicated: Secondary | ICD-10-CM

## 2022-05-16 NOTE — Progress Notes (Signed)
Because of your symptoms and no prior formal diagnosis of asthma, I feel your condition warrants further evaluation and I recommend that you be seen in a face to face visit.   NOTE: There will be NO CHARGE for this eVisit   If you are having a true medical emergency please call 911.      For an urgent face to face visit, [facility] has eight urgent care centers for your convenience:   NEW!! [facility] [place] Get Driving Directions [id number] [address] [place], [place] [facility] [place] at [place] Get Driving Directions [id number] [place], [place] [address]   [place] [facility] [place] [facility]) Get Driving Directions [id number] [address] [place], [place] [address]  [place] [facility] [place] ([place]) Get Driving Directions [id number] [address] [place],  [place]  [address]  [place] [facility] at [place] Get Driving Directions  [phone] [address] [place],  [place] [address]   [place] [facility] Get Driving Directions [id number] [place], [place], [place] [address]   [place] [facility] Get Driving Directions  [id number] [address] [place], [place] [address]   [place] [facility] at [place] Get Driving Directions [id number] [address], [place], [place] [address]  Your MyChart E-visit questionnaire answers were reviewed by a board certified advanced clinical practitioner to complete your personal care plan based on your specific symptoms.  Thank you for using e-Visits.   Inda Coke PA-C

## 2022-08-19 ENCOUNTER — Ambulatory Visit: Payer: 59 | Admitting: Family Medicine

## 2022-08-30 ENCOUNTER — Telehealth: Payer: 59 | Admitting: Family

## 2022-08-30 DIAGNOSIS — J069 Acute upper respiratory infection, unspecified: Secondary | ICD-10-CM

## 2022-08-30 MED ORDER — CETIRIZINE HCL 10 MG PO TABS: 10.0000 mg | ORAL_TABLET | Freq: Every day | ORAL | 1 refills | Status: DC

## 2022-08-30 MED ORDER — FLUTICASONE PROPIONATE 50 MCG/ACT NA SUSP: 2.0000 | Freq: Every day | NASAL | 6 refills | Status: DC

## 2022-08-30 NOTE — Progress Notes (Signed)

## 2022-08-31 ENCOUNTER — Telehealth: Payer: 59 | Admitting: Physician Assistant

## 2022-08-31 DIAGNOSIS — J069 Acute upper respiratory infection, unspecified: Secondary | ICD-10-CM

## 2022-08-31 MED ORDER — PROMETHAZINE-DM 6.25-15 MG/5ML PO SYRP: 5.0000 mL | ORAL_SOLUTION | Freq: Four times a day (QID) | ORAL | 0 refills | Status: DC | PRN

## 2022-08-31 NOTE — Progress Notes (Signed)

## 2022-09-01 ENCOUNTER — Telehealth: Payer: 59 | Admitting: Family Medicine

## 2022-09-01 DIAGNOSIS — J069 Acute upper respiratory infection, unspecified: Secondary | ICD-10-CM

## 2022-09-01 NOTE — Progress Notes (Signed)
No Charge- no change in treatment plan from last two days.

## 2022-10-22 ENCOUNTER — Encounter: Payer: Self-pay | Admitting: Family Medicine

## 2022-10-26 ENCOUNTER — Other Ambulatory Visit: Payer: Self-pay | Admitting: Family Medicine

## 2022-10-26 DIAGNOSIS — Z3200 Encounter for pregnancy test, result unknown: Secondary | ICD-10-CM

## 2022-10-26 NOTE — Addendum Note (Signed)
Addended by: Margaretann Loveless on: 10/26/2022 08:11 AM   Modules accepted: Level of Service

## 2022-10-31 ENCOUNTER — Other Ambulatory Visit: Payer: Self-pay | Admitting: Family

## 2022-10-31 NOTE — Addendum Note (Signed)
Addended by: Jannifer Rodney A on: 10/31/2022 07:38 AM   Modules accepted: Level of Service

## 2022-11-14 ENCOUNTER — Telehealth: Payer: 59 | Admitting: Family Medicine

## 2022-11-14 DIAGNOSIS — U071 COVID-19: Secondary | ICD-10-CM | POA: Diagnosis not present

## 2022-11-14 MED ORDER — FLUTICASONE PROPIONATE 50 MCG/ACT NA SUSP
2.0000 | Freq: Every day | NASAL | 0 refills | Status: DC
Start: 2022-11-14 — End: 2023-01-11

## 2022-11-14 MED ORDER — BENZONATATE 100 MG PO CAPS
100.0000 mg | ORAL_CAPSULE | Freq: Three times a day (TID) | ORAL | 0 refills | Status: AC | PRN
Start: 2022-11-14 — End: 2022-11-21

## 2022-11-14 NOTE — Progress Notes (Signed)
E-Visit  for Positive Covid Test Result   We are sorry you are not feeling well. We are here to help!  You have tested positive for COVID-19, meaning that you were infected with the novel coronavirus and could give the virus to others.  Most people with COVID-19 have mild illness and can recover at home without medical care. Do not leave your home, except to get medical care. Do not visit public areas and do not go to places where you are unable to wear a mask. It is important that you stay home  to take care for yourself and to help protect other people in your home and community.      Isolation Instructions:   You are to isolate at home until you have been fever free for at least 24 hours without a fever-reducing medication, and symptoms have been steadily improving for 24 hours. At that time,  you can end isolation but need to mask for an additional 5 days.  If you must be around other household members who do not have symptoms, you need to make sure that both you and the family members are masking consistently with a high-quality mask.  If you note any worsening of symptoms despite treatment, please seek an in-person evaluation ASAP. If you note any significant shortness of breath or any chest pain, please seek ER evaluation. Please do not delay care!   Go to the nearest hospital ED for assessment if fever/cough/breathlessness are severe or illness seems like a threat to life.    The following symptoms may appear 2-14 days after exposure: Fever Cough Shortness of breath or difficulty breathing Chills Repeated shaking with chills Muscle pain Headache Sore throat New loss of taste or smell Fatigue Congestion or runny nose Nausea or vomiting Diarrhea  You can use medication such as prescription cough medication called Tessalon Perles 100 mg. You may take 1-2 capsules every 8 hours as needed for cough and prescription for Fluticasone nasal spray 2 sprays in each nostril one time  per day  You may also take acetaminophen (Tylenol) as needed for fever.  HOME CARE: Only take medications as instructed by your medical team. Drink plenty of fluids and get plenty of rest. A steam or ultrasonic humidifier can help if you have congestion.   GET HELP RIGHT AWAY IF YOU HAVE EMERGENCY WARNING SIGNS.  Call 911 or proceed to your closest emergency facility if: You develop worsening high fever. Trouble breathing Bluish lips or face Persistent pain or pressure in the chest New confusion Inability to wake or stay awake You cough up blood. Your symptoms become more severe Inability to hold down food or fluids  This list is not all possible symptoms. Contact your medical provider for any symptoms that are severe or concerning to you.   Your e-visit answers were reviewed by a board certified advanced clinical practitioner to complete your personal care plan.  Depending on the condition, your plan could have included both over the counter or prescription medications.  If there is a problem please reply once you have received a response from your provider.  Your safety is important to [place].  If you have drug allergies check your prescription carefully.    You can use MyChart to ask questions about today's visit, request a non-urgent call back, or ask for a work or school excuse for 24 hours related to this e-Visit. If it has been greater than 24 hours you will need to follow up with your  provider, or enter a new e-Visit to address those concerns. You will get an e-mail in the next two days asking about your experience.  I hope that your e-visit has been valuable and will speed your recovery. Thank you for using e-visits.   I have spent 5 minutes in review of e-visit questionnaire, review and updating patient chart, medical decision making and response to patient.   Reed Pandy, PA-C

## 2023-01-11 ENCOUNTER — Telehealth: Payer: 59 | Admitting: Physician Assistant

## 2023-01-11 DIAGNOSIS — J019 Acute sinusitis, unspecified: Secondary | ICD-10-CM

## 2023-01-11 DIAGNOSIS — B9789 Other viral agents as the cause of diseases classified elsewhere: Secondary | ICD-10-CM | POA: Diagnosis not present

## 2023-01-11 MED ORDER — PREDNISONE 20 MG PO TABS
40.0000 mg | ORAL_TABLET | Freq: Every day | ORAL | 0 refills | Status: DC
Start: 2023-01-11 — End: 2023-08-18

## 2023-01-11 NOTE — Progress Notes (Signed)
E-Visit for Sinus Problems  We are sorry that you are not feeling well.  Here is how we plan to help!  Based on what you have shared with me it looks like you have sinusitis.  Sinusitis is inflammation and infection in the sinus cavities of the head.  Based on your presentation I believe you most likely have Acute Viral Sinusitis.This is an infection most likely caused by a virus. There is not specific treatment for viral sinusitis other than to help you with the symptoms until the infection runs its course.  You may use an oral decongestant such as Mucinex D or if you have glaucoma or high blood pressure use plain Mucinex. Saline nasal spray help and can safely be used as often as needed for congestion, I have prescribed: Prednisone 20mg  Take 2 tablets (40mg ) daily with food for 5 days   Some authorities believe that zinc sprays or the use of Echinacea may shorten the course of your symptoms.  Sinus infections are not as easily transmitted as other respiratory infection, however we still recommend that you avoid close contact with loved ones, especially the very young and elderly.  Remember to wash your hands thoroughly throughout the day as this is the number one way to prevent the spread of infection!  Home Care: Only take medications as instructed by your medical team. Do not take these medications with alcohol. A steam or ultrasonic humidifier can help congestion.  You can place a towel over your head and breathe in the steam from hot water coming from a faucet. Avoid close contacts especially the very young and the elderly. Cover your mouth when you cough or sneeze. Always remember to wash your hands.  Get Help Right Away If: You develop worsening fever or sinus pain. You develop a severe head ache or visual changes. Your symptoms persist after you have completed your treatment plan.  Make sure you Understand these instructions. Will watch your condition. Will get help right away if  you are not doing well or get worse.   Thank you for choosing an e-visit.  Your e-visit answers were reviewed by a board certified advanced clinical practitioner to complete your personal care plan. Depending upon the condition, your plan could have included both over the counter or prescription medications.  Please review your pharmacy choice. Make sure the pharmacy is open so you can pick up prescription now. If there is a problem, you may contact your provider through [employer] and have the prescription routed to another pharmacy.  Your safety is important to [place]. If you have drug allergies check your prescription carefully.   For the next 24 hours you can use MyChart to ask questions about today's visit, request a non-urgent call back, or ask for a work or school excuse. You will get an email in the next two days asking about your experience. I hope that your e-visit has been valuable and will speed your recovery.   I have spent 5 minutes in review of e-visit questionnaire, review and updating patient chart, medical decision making and response to patient.   Margaretann Loveless, PA-C

## 2023-02-28 ENCOUNTER — Telehealth: Payer: 59 | Admitting: Physician Assistant

## 2023-02-28 DIAGNOSIS — R0602 Shortness of breath: Secondary | ICD-10-CM

## 2023-02-28 NOTE — Progress Notes (Signed)
Because of your symptoms, I feel your condition warrants further evaluation and I recommend that you be seen in a face to face visit.   NOTE: There will be NO CHARGE for this eVisit   If you are having a true medical emergency please call 911.      For an urgent face to face visit, [facility] has eight urgent care centers for your convenience:   NEW!! [facility] at [facility] Get Driving Directions [phone] [address], [address] [place], [address]    [facility] at [facility] Get Driving Directions [phone] [address] [place], [place] [address]   [facility] Center [facility]) Get Driving Directions [phone] [address] [place], [place] [address]  [facility]) Get Driving Directions [phone] [address] [place],  [place]  [address]  [facility] Health Urgent Care Center [facility] - at [employer]  [phone] [address] [place],  [place] [address]   [facility] Health Urgent Care at [facility] Get Driving Directions [phone] [address] [place] [address] [place], [place] [address]   [facility] Health Urgent Care at [facility] Get Driving Directions  [phone] [address] [place], [place] [address]   [facility] Health Urgent Care at [facility] Directions [phone] [address], [address] [place], [place] [address]  Your MyChart E-visit questionnaire answers were reviewed by a board certified advanced clinical practitioner to complete your personal care plan based on your specific symptoms.  Thank you for using e-Visits.

## 2023-05-20 ENCOUNTER — Other Ambulatory Visit (HOSPITAL_COMMUNITY): Payer: Self-pay

## 2023-05-20 MED ORDER — NORELGESTROMIN-ETH ESTRADIOL 150-35 MCG/24HR TD PTWK
1.0000 | MEDICATED_PATCH | TRANSDERMAL | 4 refills | Status: AC
  Filled 2023-05-20: qty 9, 84d supply, fill #0
  Filled 2023-08-08: qty 9, 84d supply, fill #1
  Filled 2023-10-30: qty 9, 84d supply, fill #2
  Filled 2024-01-22: qty 9, 84d supply, fill #3

## 2023-07-18 ENCOUNTER — Encounter: Admitting: Nurse Practitioner

## 2023-07-18 DIAGNOSIS — J984 Other disorders of lung: Secondary | ICD-10-CM

## 2023-07-18 NOTE — Progress Notes (Signed)
 I have spent 5 minutes in review of e-visit questionnaire, review and updating patient chart, medical decision making and response to patient.   Claiborne Rigg, NP

## 2023-07-18 NOTE — Progress Notes (Signed)
 Because of your symptoms and no prior formal diagnosis of asthma, I feel your condition warrants further evaluation and I recommend that you be seen in a face to face visit urgently.     NOTE: There will be NO CHARGE for this E-Visit   If you are having a true medical emergency, please call 911.     For an urgent face to face visit, [facility] has multiple urgent care centers for your convenience.  Click the link below for the full list of locations and hours, walk-in wait times, appointment scheduling options and driving directions:  Urgent Care - [place], [place], [place], [place], [place], [place]  [place]     Your MyChart E-visit questionnaire answers were reviewed by a board certified advanced clinical practitioner to complete your personal care plan based on your specific symptoms.    Thank you for using e-Visits.    NOTE: There will be NO CHARGE for this eVisit

## 2023-08-18 ENCOUNTER — Telehealth: Admitting: Physician Assistant

## 2023-08-18 DIAGNOSIS — R3989 Other symptoms and signs involving the genitourinary system: Secondary | ICD-10-CM | POA: Diagnosis not present

## 2023-08-18 MED ORDER — NITROFURANTOIN MONOHYD MACRO 100 MG PO CAPS
100.0000 mg | ORAL_CAPSULE | Freq: Two times a day (BID) | ORAL | 0 refills | Status: AC
Start: 2023-08-18 — End: ?

## 2023-08-18 NOTE — Progress Notes (Signed)
 I have spent 5 minutes in review of e-visit questionnaire, review and updating patient chart, medical decision making and response to patient.   Piedad Climes, PA-C

## 2023-08-18 NOTE — Progress Notes (Signed)

## 2024-02-20 ENCOUNTER — Encounter: Payer: Self-pay | Admitting: Family Medicine
